# Patient Record
Sex: Female | Born: 1945 | Race: White | Hispanic: No | Marital: Married | Smoking: Never smoker
Health system: Southern US, Community
[De-identification: ages and names within clinical notes are randomized; demographics above are authoritative.]

## PROBLEM LIST (undated history)

## (undated) DIAGNOSIS — J45909 Unspecified asthma, uncomplicated: Secondary | ICD-10-CM

## (undated) DIAGNOSIS — I639 Cerebral infarction, unspecified: Secondary | ICD-10-CM

## (undated) DIAGNOSIS — I10 Essential (primary) hypertension: Secondary | ICD-10-CM

## (undated) DIAGNOSIS — E785 Hyperlipidemia, unspecified: Secondary | ICD-10-CM

## (undated) HISTORY — DX: Essential (primary) hypertension: I10

## (undated) HISTORY — DX: Cerebral infarction, unspecified: I63.9

## (undated) HISTORY — DX: Unspecified asthma, uncomplicated: J45.909

## (undated) HISTORY — PX: APPENDECTOMY: SHX54

## (undated) HISTORY — PX: TONSILLECTOMY: SHX5217

## (undated) HISTORY — DX: Hyperlipidemia, unspecified: E78.5

---

## 2007-12-21 DIAGNOSIS — I10 Essential (primary) hypertension: Secondary | ICD-10-CM | POA: Insufficient documentation

## 2007-12-22 DIAGNOSIS — Z803 Family history of malignant neoplasm of breast: Secondary | ICD-10-CM | POA: Insufficient documentation

## 2009-06-15 DIAGNOSIS — B029 Zoster without complications: Secondary | ICD-10-CM | POA: Insufficient documentation

## 2010-02-18 ENCOUNTER — Ambulatory Visit: Payer: Self-pay | Admitting: Family Medicine

## 2010-02-18 DIAGNOSIS — R7309 Other abnormal glucose: Secondary | ICD-10-CM | POA: Insufficient documentation

## 2011-12-05 DIAGNOSIS — Z23 Encounter for immunization: Secondary | ICD-10-CM | POA: Diagnosis not present

## 2011-12-05 DIAGNOSIS — I10 Essential (primary) hypertension: Secondary | ICD-10-CM | POA: Diagnosis not present

## 2011-12-05 DIAGNOSIS — E7800 Pure hypercholesterolemia, unspecified: Secondary | ICD-10-CM | POA: Diagnosis not present

## 2011-12-05 DIAGNOSIS — R7309 Other abnormal glucose: Secondary | ICD-10-CM | POA: Diagnosis not present

## 2011-12-05 DIAGNOSIS — J45901 Unspecified asthma with (acute) exacerbation: Secondary | ICD-10-CM | POA: Diagnosis not present

## 2012-01-18 ENCOUNTER — Ambulatory Visit: Payer: Self-pay | Admitting: Family Medicine

## 2012-01-18 DIAGNOSIS — R7309 Other abnormal glucose: Secondary | ICD-10-CM | POA: Diagnosis not present

## 2012-01-18 DIAGNOSIS — I10 Essential (primary) hypertension: Secondary | ICD-10-CM | POA: Diagnosis not present

## 2012-01-18 DIAGNOSIS — Z23 Encounter for immunization: Secondary | ICD-10-CM | POA: Diagnosis not present

## 2012-01-18 DIAGNOSIS — R079 Chest pain, unspecified: Secondary | ICD-10-CM | POA: Diagnosis not present

## 2012-02-01 ENCOUNTER — Ambulatory Visit: Payer: Self-pay | Admitting: Family Medicine

## 2012-02-01 DIAGNOSIS — R091 Pleurisy: Secondary | ICD-10-CM | POA: Diagnosis not present

## 2012-02-01 DIAGNOSIS — R059 Cough, unspecified: Secondary | ICD-10-CM | POA: Diagnosis not present

## 2012-02-01 DIAGNOSIS — I498 Other specified cardiac arrhythmias: Secondary | ICD-10-CM | POA: Diagnosis not present

## 2012-02-01 DIAGNOSIS — I10 Essential (primary) hypertension: Secondary | ICD-10-CM | POA: Diagnosis not present

## 2012-02-01 DIAGNOSIS — R918 Other nonspecific abnormal finding of lung field: Secondary | ICD-10-CM | POA: Diagnosis not present

## 2012-02-01 DIAGNOSIS — J189 Pneumonia, unspecified organism: Secondary | ICD-10-CM | POA: Diagnosis not present

## 2012-02-01 DIAGNOSIS — R079 Chest pain, unspecified: Secondary | ICD-10-CM | POA: Diagnosis not present

## 2012-03-08 DIAGNOSIS — D72829 Elevated white blood cell count, unspecified: Secondary | ICD-10-CM | POA: Diagnosis not present

## 2012-06-05 DIAGNOSIS — R7309 Other abnormal glucose: Secondary | ICD-10-CM | POA: Diagnosis not present

## 2012-06-05 DIAGNOSIS — I10 Essential (primary) hypertension: Secondary | ICD-10-CM | POA: Diagnosis not present

## 2012-06-05 DIAGNOSIS — E7800 Pure hypercholesterolemia, unspecified: Secondary | ICD-10-CM | POA: Diagnosis not present

## 2012-06-05 DIAGNOSIS — J45901 Unspecified asthma with (acute) exacerbation: Secondary | ICD-10-CM | POA: Diagnosis not present

## 2012-12-14 DIAGNOSIS — E7800 Pure hypercholesterolemia, unspecified: Secondary | ICD-10-CM | POA: Diagnosis not present

## 2012-12-14 DIAGNOSIS — I498 Other specified cardiac arrhythmias: Secondary | ICD-10-CM | POA: Diagnosis not present

## 2012-12-14 DIAGNOSIS — I10 Essential (primary) hypertension: Secondary | ICD-10-CM | POA: Diagnosis not present

## 2012-12-14 DIAGNOSIS — J45901 Unspecified asthma with (acute) exacerbation: Secondary | ICD-10-CM | POA: Diagnosis not present

## 2013-07-15 DIAGNOSIS — R7309 Other abnormal glucose: Secondary | ICD-10-CM | POA: Diagnosis not present

## 2013-07-15 DIAGNOSIS — I498 Other specified cardiac arrhythmias: Secondary | ICD-10-CM | POA: Diagnosis not present

## 2013-07-15 DIAGNOSIS — E7800 Pure hypercholesterolemia, unspecified: Secondary | ICD-10-CM | POA: Diagnosis not present

## 2013-07-15 DIAGNOSIS — I10 Essential (primary) hypertension: Secondary | ICD-10-CM | POA: Diagnosis not present

## 2013-08-06 DIAGNOSIS — Z23 Encounter for immunization: Secondary | ICD-10-CM | POA: Diagnosis not present

## 2013-10-17 DIAGNOSIS — E785 Hyperlipidemia, unspecified: Secondary | ICD-10-CM | POA: Diagnosis not present

## 2013-10-17 DIAGNOSIS — J45909 Unspecified asthma, uncomplicated: Secondary | ICD-10-CM | POA: Diagnosis not present

## 2013-10-17 DIAGNOSIS — I10 Essential (primary) hypertension: Secondary | ICD-10-CM | POA: Diagnosis not present

## 2014-01-13 DIAGNOSIS — E039 Hypothyroidism, unspecified: Secondary | ICD-10-CM | POA: Diagnosis not present

## 2014-01-13 DIAGNOSIS — E038 Other specified hypothyroidism: Secondary | ICD-10-CM | POA: Diagnosis not present

## 2014-01-13 DIAGNOSIS — R7309 Other abnormal glucose: Secondary | ICD-10-CM | POA: Diagnosis not present

## 2014-01-13 DIAGNOSIS — M79609 Pain in unspecified limb: Secondary | ICD-10-CM | POA: Diagnosis not present

## 2014-01-13 DIAGNOSIS — I498 Other specified cardiac arrhythmias: Secondary | ICD-10-CM | POA: Diagnosis not present

## 2014-07-09 DIAGNOSIS — E038 Other specified hypothyroidism: Secondary | ICD-10-CM | POA: Diagnosis not present

## 2014-07-09 DIAGNOSIS — I10 Essential (primary) hypertension: Secondary | ICD-10-CM | POA: Diagnosis not present

## 2014-07-09 DIAGNOSIS — R7309 Other abnormal glucose: Secondary | ICD-10-CM | POA: Diagnosis not present

## 2014-07-09 DIAGNOSIS — E7800 Pure hypercholesterolemia, unspecified: Secondary | ICD-10-CM | POA: Diagnosis not present

## 2014-07-09 DIAGNOSIS — I498 Other specified cardiac arrhythmias: Secondary | ICD-10-CM | POA: Diagnosis not present

## 2014-07-09 DIAGNOSIS — J45909 Unspecified asthma, uncomplicated: Secondary | ICD-10-CM | POA: Diagnosis not present

## 2014-07-09 LAB — CBC AND DIFFERENTIAL
HEMATOCRIT: 46 % (ref 36–46)
HEMOGLOBIN: 15.2 g/dL (ref 12.0–16.0)
Platelets: 255 10*3/uL (ref 150–399)
WBC: 5.7 10^3/mL

## 2014-07-09 LAB — LIPID PANEL
Cholesterol: 157 mg/dL (ref 0–200)
HDL: 53 mg/dL (ref 35–70)
LDL Cholesterol: 82 mg/dL
Triglycerides: 112 mg/dL (ref 40–160)

## 2014-07-09 LAB — TSH: TSH: 3.18 u[IU]/mL (ref 0.41–5.90)

## 2015-02-04 DIAGNOSIS — J45909 Unspecified asthma, uncomplicated: Secondary | ICD-10-CM | POA: Diagnosis not present

## 2015-02-04 DIAGNOSIS — R7309 Other abnormal glucose: Secondary | ICD-10-CM | POA: Diagnosis not present

## 2015-02-04 LAB — HEPATIC FUNCTION PANEL
ALT: 16 U/L (ref 7–35)
AST: 24 U/L (ref 13–35)

## 2015-02-04 LAB — BASIC METABOLIC PANEL
BUN: 16 mg/dL (ref 4–21)
CREATININE: 0.9 mg/dL (ref 0.5–1.1)
Glucose: 107 mg/dL
POTASSIUM: 4.4 mmol/L (ref 3.4–5.3)
Sodium: 142 mmol/L (ref 137–147)

## 2015-02-04 LAB — HEMOGLOBIN A1C: HEMOGLOBIN A1C: 5.8 % (ref 4.0–6.0)

## 2015-06-02 ENCOUNTER — Telehealth: Payer: Self-pay | Admitting: Family Medicine

## 2015-06-02 NOTE — Telephone Encounter (Signed)
I spoke with Marisue Humble, I advised her that we did not change her Losartan.  I verified that her bottle says Losartan .  I advised her that the pharmacy may have changed manufacturing companies, she is going to call to make sure.   Thanks,   -Vernona Rieger

## 2015-06-02 NOTE — Telephone Encounter (Signed)
Pt stated that she picked up her Losartan from the pharmacy and it looks different than what she usually gets. (Pt said the pharmacy changed her medication without her knowledge) Pt would like to speak with a nurse to make sure it is on to take. Thanks TNP

## 2015-06-02 NOTE — Telephone Encounter (Signed)
Ok. Thanks!

## 2015-07-17 ENCOUNTER — Other Ambulatory Visit: Payer: Self-pay | Admitting: Family Medicine

## 2015-07-17 DIAGNOSIS — E038 Other specified hypothyroidism: Secondary | ICD-10-CM | POA: Insufficient documentation

## 2015-07-17 DIAGNOSIS — J309 Allergic rhinitis, unspecified: Secondary | ICD-10-CM | POA: Insufficient documentation

## 2015-07-17 DIAGNOSIS — J45909 Unspecified asthma, uncomplicated: Secondary | ICD-10-CM | POA: Insufficient documentation

## 2015-07-17 DIAGNOSIS — Z719 Counseling, unspecified: Secondary | ICD-10-CM | POA: Insufficient documentation

## 2015-07-17 DIAGNOSIS — M79603 Pain in arm, unspecified: Secondary | ICD-10-CM | POA: Insufficient documentation

## 2015-07-17 DIAGNOSIS — E039 Hypothyroidism, unspecified: Secondary | ICD-10-CM | POA: Insufficient documentation

## 2015-07-17 DIAGNOSIS — R142 Eructation: Secondary | ICD-10-CM | POA: Insufficient documentation

## 2015-07-17 DIAGNOSIS — Z23 Encounter for immunization: Secondary | ICD-10-CM | POA: Insufficient documentation

## 2015-07-17 DIAGNOSIS — I10 Essential (primary) hypertension: Secondary | ICD-10-CM

## 2015-07-17 NOTE — Telephone Encounter (Signed)
Next ov appointment is on 08/07/2015.  Thanks,

## 2015-08-07 ENCOUNTER — Encounter: Payer: Self-pay | Admitting: Family Medicine

## 2015-08-19 ENCOUNTER — Encounter: Payer: Self-pay | Admitting: Family Medicine

## 2015-08-19 ENCOUNTER — Ambulatory Visit (INDEPENDENT_AMBULATORY_CARE_PROVIDER_SITE_OTHER): Payer: Medicare Other | Admitting: Family Medicine

## 2015-08-19 VITALS — BP 104/68 | HR 64 | Temp 97.7°F | Resp 16 | Ht 64.0 in | Wt 170.0 lb

## 2015-08-19 DIAGNOSIS — E7800 Pure hypercholesterolemia, unspecified: Secondary | ICD-10-CM | POA: Diagnosis not present

## 2015-08-19 DIAGNOSIS — I10 Essential (primary) hypertension: Secondary | ICD-10-CM | POA: Diagnosis not present

## 2015-08-19 DIAGNOSIS — E785 Hyperlipidemia, unspecified: Secondary | ICD-10-CM | POA: Diagnosis not present

## 2015-08-19 DIAGNOSIS — R7309 Other abnormal glucose: Secondary | ICD-10-CM | POA: Diagnosis not present

## 2015-08-19 LAB — POCT GLYCOSYLATED HEMOGLOBIN (HGB A1C)
Est. average glucose Bld gHb Est-mCnc: 117
Hemoglobin A1C: 5.7

## 2015-08-19 MED ORDER — SIMVASTATIN 20 MG PO TABS: 20.0000 mg | ORAL_TABLET | Freq: Every day | ORAL | Status: DC

## 2015-08-19 MED ORDER — AMLODIPINE BESYLATE 5 MG PO TABS: 5.0000 mg | ORAL_TABLET | Freq: Every day | ORAL | Status: DC

## 2015-08-19 MED ORDER — AMLODIPINE BESYLATE 2.5 MG PO TABS: 2.5000 mg | ORAL_TABLET | Freq: Every day | ORAL | Status: DC

## 2015-08-19 NOTE — Progress Notes (Signed)
Subjective:    Patient ID: Michele Lawrence, female    DOB: 1945-12-19, 69 y.o.   MRN: [id number]  HPI Comments: Pt needs refills on Amlodipine and Simvastatin sent to [facility] [place] [place].  Hyperglycemia This is a chronic (Last A1C 02/04/2015. 5.4%.) problem. The current episode started today. The problem has been unchanged. Associated symptoms include a fever (2 weeks ago. Resolved on it's own.  Was 101.  ). Pertinent negatives include no abdominal pain, anorexia, arthralgias, change in bowel habit, chest pain, chills, congestion, coughing, diaphoresis, fatigue, headaches, joint swelling, nausea, neck pain, numbness, rash, sore throat, swollen glands, urinary symptoms, vertigo, visual change, vomiting or weakness. Myalgias: rib pain/ "infection".  Strained it.  Got better.    Hypertension This is a chronic problem. Pertinent negatives include no anxiety, blurred vision, chest pain, headaches, malaise/fatigue, neck pain, orthopnea, palpitations, peripheral edema, shortness of breath or sweats. Treatments tried: Amlodipine 5 mg, Losartan 10 mg. The current treatment provides moderate improvement. There are no compliance problems.   Hyperlipidemia This is a chronic problem. Recent lipid tests were reviewed and are normal (07/09/2014- Total- 157, Trig- 112, LDL- 82, HDL- 53). Pertinent negatives include no chest pain or shortness of breath. Myalgias: rib pain/ "infection".  Strained it.  Got better.   Current antihyperlipidemic treatment includes statins (Simvastatin 20 mg). There are no compliance problems.  Risk factors for coronary artery disease include dyslipidemia and post-menopausal.   Has been very active. Helping her [place] who [place] moved to [place].     Review of Systems  Constitutional: Positive for fever (2 weeks ago. Resolved on it's own.  Was 101.  ). Negative for chills, malaise/fatigue, diaphoresis and fatigue.  HENT: Negative for congestion and sore throat.   Eyes:  Negative for blurred vision.  Respiratory: Negative for cough and shortness of breath.   Cardiovascular: Negative for chest pain, palpitations and orthopnea.  Gastrointestinal: Negative for nausea, vomiting, abdominal pain, anorexia and change in bowel habit.  Musculoskeletal: Negative for joint swelling, arthralgias and neck pain. Myalgias: rib pain/ "infection".  Strained it.  Got better.    Skin: Negative for rash.  Neurological: Negative for vertigo, weakness, numbness and headaches.   BP 104/68 mmHg  Pulse 64  Temp(Src) 97.7 F (36.5 C) (Oral)  Resp 16  Ht 5\' 4"  (1.626 m)  Wt 170 lb (77.111 kg)  BMI 29.17 kg/m2   Patient Active Problem List   Diagnosis Date Noted  . Need for vaccination 07/17/2015  . Subclinical hypothyroidism 07/17/2015  . Calcium blood increased 07/17/2015  . Encounter for counseling 07/17/2015  . Belching 07/17/2015  . Airway hyperreactivity 07/17/2015  . Arm pain 07/17/2015  . Allergic rhinitis 07/17/2015  . Encounter for general adult medical examination without abnormal findings 02/18/2010  . Abnormal blood sugar 02/18/2010  . Herpes zona 06/15/2009  . Family history of breast cancer 12/22/2007  . Benign hypertension 12/21/2007  . Acute asthma exacerbation 05/17/2007  . Hypercholesteremia 04/17/2007   No past medical history on file. Current Outpatient Prescriptions on File Prior to Visit  Medication Sig  . albuterol (VENTOLIN HFA) 108 (90 BASE) MCG/ACT inhaler Inhale 2 sprays into the lungs. Every 6 hours PRN  . amLODipine (NORVASC) 5 MG tablet Take 1 tablet by mouth daily.  Marland Kitchen. aspirin 81 MG tablet   . calcium carbonate (TUMS) 500 MG chewable tablet Chew 1 tablet by mouth as needed.  Marland Kitchen. ibuprofen (ADVIL) 200 MG tablet Take by mouth. 1 tablet q 4-6 hours PRN  .  loratadine (CLARITIN) 10 MG tablet Take 1 tablet by mouth daily.  Marland Kitchen losartan (COZAAR) 50 MG tablet TAKE ONE TABLET BY MOUTH ONCE DAILY  . MULTIPLE VITAMINS-MINERALS PO Take 1 tablet by  mouth daily.  . simvastatin (ZOCOR) 20 MG tablet Take 1 tablet by mouth daily.  Marland Kitchen acetaminophen (TYLENOL) 500 MG tablet Take by mouth.   No current facility-administered medications on file prior to visit.   Allergies  Allergen Reactions  . Penicillins    No past surgical history on file. Social History   Social History  . Marital Status: Married    Spouse Name: N/A  . Number of Children: N/A  . Years of Education: N/A   Occupational History  . Not on file.   Social History Main Topics  . Smoking status: Never Smoker   . Smokeless tobacco: Never Used  . Alcohol Use: Yes     Comment: 1/2 glass of wine qhs  . Drug Use: No  . Sexual Activity: Not on file   Other Topics Concern  . Not on file   Social History Narrative   Family History  Problem Relation Age of Onset  . Cancer Mother     breast  . Diabetes Mother   . Stroke Mother   . CVA Mother   . Cataracts Mother   . Psoriasis Mother   . Alcohol abuse Father   . Heart disease Sister        Objective:   Physical Exam  Constitutional: She is oriented to person, place, and time. She appears well-developed and well-nourished.  Cardiovascular: Normal rate and regular rhythm.   Pulmonary/Chest: Effort normal and breath sounds normal.  Musculoskeletal: She exhibits no edema.  Neurological: She is alert and oriented to person, place, and time.  Skin: Skin is warm and dry.  Psychiatric: She has a normal mood and affect. Her behavior is normal. Judgment and thought content normal.   BP 104/68 mmHg  Pulse 64  Temp(Src) 97.7 F (36.5 C) (Oral)  Resp 16  Ht  (1.626 m)  Wt 170 lb (77.111 kg)  BMI 29.17 kg/m2      Assessment & Plan:  1. Abnormal blood sugar Stable. Continue current lifestyle  and recheck in 6 months.  Stay active and continue to eat healthy. - POCT glycosylated hemoglobin (Hb A1C) Results for orders placed or performed in visit on 08/19/15  POCT glycosylated hemoglobin (Hb A1C)  Result  Value Ref Range   Hemoglobin A1C 5.7    Est. average glucose Bld gHb Est-mCnc 117     2. Hyperlipidemia Stable. Continue current medication. Time to recheck labs. No current side effects.   - Lipid panel - Comprehensive metabolic panel - CBC with Differential/Platelet - simvastatin (ZOCOR) 20 MG tablet; Take 1 tablet (20 mg total) by mouth daily.  Dispense: 30 tablet; Refill: 6  3. Benign hypertension Stable. Will decrease amlodipine to 2.5 mg  and continue to monitor.  Call if not well controlled and will increase back up.  - Comprehensive metabolic panel - CBC with Differential/Platelet  Lorie Phenix, MD

## 2015-08-20 ENCOUNTER — Telehealth: Payer: Self-pay

## 2015-08-20 LAB — CBC WITH DIFFERENTIAL/PLATELET
BASOS ABS: 0 10*3/uL (ref 0.0–0.2)
BASOS: 1 %
EOS (ABSOLUTE): 0.1 10*3/uL (ref 0.0–0.4)
Eos: 3 %
Hematocrit: 42.9 % (ref 34.0–46.6)
Hemoglobin: 14 g/dL (ref 11.1–15.9)
IMMATURE GRANS (ABS): 0 10*3/uL (ref 0.0–0.1)
IMMATURE GRANULOCYTES: 1 %
LYMPHS: 23 %
Lymphocytes Absolute: 1.2 10*3/uL (ref 0.7–3.1)
MCH: 28.7 pg (ref 26.6–33.0)
MCHC: 32.6 g/dL (ref 31.5–35.7)
MCV: 88 fL (ref 79–97)
Monocytes Absolute: 0.5 10*3/uL (ref 0.1–0.9)
Monocytes: 10 %
NEUTROS PCT: 62 %
Neutrophils Absolute: 3.3 10*3/uL (ref 1.4–7.0)
PLATELETS: 289 10*3/uL (ref 150–379)
RBC: 4.88 x10E6/uL (ref 3.77–5.28)
RDW: 13.8 % (ref 12.3–15.4)
WBC: 5.2 10*3/uL (ref 3.4–10.8)

## 2015-08-20 LAB — COMPREHENSIVE METABOLIC PANEL
A/G RATIO: 2.3 (ref 1.1–2.5)
ALBUMIN: 4.5 g/dL (ref 3.5–4.8)
ALT: 16 IU/L (ref 0–32)
AST: 25 IU/L (ref 0–40)
Alkaline Phosphatase: 82 IU/L (ref 39–117)
BILIRUBIN TOTAL: 0.5 mg/dL (ref 0.0–1.2)
BUN/Creatinine Ratio: 21 (ref 11–26)
BUN: 18 mg/dL (ref 8–27)
CALCIUM: 9.8 mg/dL (ref 8.7–10.3)
CHLORIDE: 103 mmol/L (ref 97–108)
CO2: 24 mmol/L (ref 18–29)
Creatinine, Ser: 0.84 mg/dL (ref 0.57–1.00)
GFR calc Af Amer: 81 mL/min/{1.73_m2} (ref >59)
GFR, EST NON AFRICAN AMERICAN: 71 mL/min/{1.73_m2} (ref >59)
Globulin, Total: 2 g/dL (ref 1.5–4.5)
Glucose: 108 mg/dL — ABNORMAL HIGH (ref 65–99)
POTASSIUM: 4.6 mmol/L (ref 3.5–5.2)
Sodium: 144 mmol/L (ref 134–144)
TOTAL PROTEIN: 6.5 g/dL (ref 6.0–8.5)

## 2015-08-20 LAB — LIPID PANEL
CHOL/HDL RATIO: 3.1 ratio (ref 0.0–4.4)
CHOLESTEROL TOTAL: 168 mg/dL (ref 100–199)
HDL: 54 mg/dL (ref >39)
LDL Calculated: 94 mg/dL (ref 0–99)
TRIGLYCERIDES: 102 mg/dL (ref 0–149)
VLDL Cholesterol Cal: 20 mg/dL (ref 5–40)

## 2015-08-20 NOTE — Telephone Encounter (Signed)
Pt advised.   Thanks,   -Audrey Thull  

## 2015-08-20 NOTE — Telephone Encounter (Signed)
LMTCB 08/20/2015  Thanks,  -Takeira Yanes  

## 2015-08-20 NOTE — Telephone Encounter (Signed)
-----   Message from Lorie PhenixNancy Maloney, MD sent at 08/20/2015  7:54 AM EDT ----- Stable. Please notify patient. Thanks.

## 2015-09-25 ENCOUNTER — Other Ambulatory Visit: Payer: Self-pay | Admitting: Family Medicine

## 2015-09-25 DIAGNOSIS — J45909 Unspecified asthma, uncomplicated: Secondary | ICD-10-CM

## 2015-12-13 DIAGNOSIS — R9089 Other abnormal findings on diagnostic imaging of central nervous system: Secondary | ICD-10-CM | POA: Diagnosis not present

## 2015-12-13 DIAGNOSIS — E785 Hyperlipidemia, unspecified: Secondary | ICD-10-CM | POA: Diagnosis not present

## 2015-12-13 DIAGNOSIS — I63511 Cerebral infarction due to unspecified occlusion or stenosis of right middle cerebral artery: Secondary | ICD-10-CM | POA: Diagnosis not present

## 2015-12-13 DIAGNOSIS — R0689 Other abnormalities of breathing: Secondary | ICD-10-CM | POA: Diagnosis not present

## 2015-12-13 DIAGNOSIS — I471 Supraventricular tachycardia: Secondary | ICD-10-CM | POA: Diagnosis not present

## 2015-12-13 DIAGNOSIS — E876 Hypokalemia: Secondary | ICD-10-CM | POA: Diagnosis not present

## 2015-12-13 DIAGNOSIS — R739 Hyperglycemia, unspecified: Secondary | ICD-10-CM | POA: Diagnosis not present

## 2015-12-13 DIAGNOSIS — R0789 Other chest pain: Secondary | ICD-10-CM | POA: Diagnosis not present

## 2015-12-13 DIAGNOSIS — R930 Abnormal findings on diagnostic imaging of skull and head, not elsewhere classified: Secondary | ICD-10-CM | POA: Diagnosis not present

## 2015-12-13 DIAGNOSIS — G8194 Hemiplegia, unspecified affecting left nondominant side: Secondary | ICD-10-CM | POA: Diagnosis not present

## 2015-12-13 DIAGNOSIS — Z880 Allergy status to penicillin: Secondary | ICD-10-CM | POA: Diagnosis not present

## 2015-12-13 DIAGNOSIS — I63311 Cerebral infarction due to thrombosis of right middle cerebral artery: Secondary | ICD-10-CM | POA: Diagnosis not present

## 2015-12-13 DIAGNOSIS — I10 Essential (primary) hypertension: Secondary | ICD-10-CM | POA: Diagnosis not present

## 2015-12-13 DIAGNOSIS — R29704 NIHSS score 4: Secondary | ICD-10-CM | POA: Diagnosis present

## 2015-12-13 DIAGNOSIS — J45909 Unspecified asthma, uncomplicated: Secondary | ICD-10-CM | POA: Diagnosis present

## 2015-12-13 DIAGNOSIS — I639 Cerebral infarction, unspecified: Secondary | ICD-10-CM | POA: Diagnosis not present

## 2015-12-13 DIAGNOSIS — R079 Chest pain, unspecified: Secondary | ICD-10-CM | POA: Diagnosis not present

## 2015-12-13 DIAGNOSIS — Z8249 Family history of ischemic heart disease and other diseases of the circulatory system: Secondary | ICD-10-CM | POA: Diagnosis not present

## 2015-12-13 DIAGNOSIS — R0602 Shortness of breath: Secondary | ICD-10-CM | POA: Diagnosis not present

## 2015-12-13 DIAGNOSIS — I6521 Occlusion and stenosis of right carotid artery: Secondary | ICD-10-CM | POA: Diagnosis present

## 2015-12-14 DIAGNOSIS — I639 Cerebral infarction, unspecified: Secondary | ICD-10-CM | POA: Insufficient documentation

## 2015-12-14 DIAGNOSIS — Z8673 Personal history of transient ischemic attack (TIA), and cerebral infarction without residual deficits: Secondary | ICD-10-CM | POA: Insufficient documentation

## 2015-12-18 DIAGNOSIS — I69319 Unspecified symptoms and signs involving cognitive functions following cerebral infarction: Secondary | ICD-10-CM | POA: Diagnosis not present

## 2015-12-18 DIAGNOSIS — I10 Essential (primary) hypertension: Secondary | ICD-10-CM | POA: Diagnosis not present

## 2015-12-18 DIAGNOSIS — Z9181 History of falling: Secondary | ICD-10-CM | POA: Diagnosis not present

## 2015-12-18 DIAGNOSIS — J45909 Unspecified asthma, uncomplicated: Secondary | ICD-10-CM | POA: Diagnosis not present

## 2015-12-18 DIAGNOSIS — I69354 Hemiplegia and hemiparesis following cerebral infarction affecting left non-dominant side: Secondary | ICD-10-CM | POA: Diagnosis not present

## 2015-12-18 DIAGNOSIS — E785 Hyperlipidemia, unspecified: Secondary | ICD-10-CM | POA: Diagnosis not present

## 2015-12-19 DIAGNOSIS — Z9181 History of falling: Secondary | ICD-10-CM | POA: Diagnosis not present

## 2015-12-19 DIAGNOSIS — I69319 Unspecified symptoms and signs involving cognitive functions following cerebral infarction: Secondary | ICD-10-CM | POA: Diagnosis not present

## 2015-12-19 DIAGNOSIS — J45909 Unspecified asthma, uncomplicated: Secondary | ICD-10-CM | POA: Diagnosis not present

## 2015-12-19 DIAGNOSIS — E785 Hyperlipidemia, unspecified: Secondary | ICD-10-CM | POA: Diagnosis not present

## 2015-12-19 DIAGNOSIS — I69354 Hemiplegia and hemiparesis following cerebral infarction affecting left non-dominant side: Secondary | ICD-10-CM | POA: Diagnosis not present

## 2015-12-19 DIAGNOSIS — I10 Essential (primary) hypertension: Secondary | ICD-10-CM | POA: Diagnosis not present

## 2015-12-21 ENCOUNTER — Other Ambulatory Visit: Payer: Self-pay

## 2015-12-22 ENCOUNTER — Ambulatory Visit (INDEPENDENT_AMBULATORY_CARE_PROVIDER_SITE_OTHER): Payer: Medicare Other | Admitting: Family Medicine

## 2015-12-22 ENCOUNTER — Encounter: Payer: Self-pay | Admitting: Family Medicine

## 2015-12-22 VITALS — BP 118/68 | HR 64 | Temp 97.6°F | Resp 16 | Wt 171.0 lb

## 2015-12-22 DIAGNOSIS — I639 Cerebral infarction, unspecified: Secondary | ICD-10-CM | POA: Diagnosis not present

## 2015-12-22 DIAGNOSIS — E785 Hyperlipidemia, unspecified: Secondary | ICD-10-CM | POA: Diagnosis not present

## 2015-12-22 MED ORDER — ATORVASTATIN CALCIUM 80 MG PO TABS: 80.0000 mg | ORAL_TABLET | Freq: Every day | ORAL | Status: DC

## 2015-12-22 NOTE — Progress Notes (Signed)
Subjective:     Patient ID: Michele Lawrence, female   DOB: 15-Nov-1945, 70 y.o.   MRN: [id number]  Chief Complaint  Patient presents with  . Hospitalization Follow-up    HPI  Here for follow-up after hospitalization at [facility] 2/5-12/15/15 after stroke. Event started with "asthma attack", she used Ventolin at that point. Then she had sudden onset of weakness all over "like jello, numbness and unable to move left arm and leg. No slurring but quiet speech. No loss of consciousness or bowel or bladder function. Family gave her ASA, and drove her to [facility]. She was given tPA and  admitted to ICU. She started to regain movement and sensation of her left side later that night, approximately 8 hrs after tPA. She was discharged on 81 ASA, statin changed to atorvastatin 80 mg.   Pt currently report she still has  quiet voice. 90% strength on left hand, full strength left leg. She feels tired but not weak. She has a walker which she is not using.  She has PT at home 3 times a week.  Imaging in the hospital was negative for ischemia.  She is continuing to improve.   She is here with husband today. She does seem slightly more confused and unsure of herself than usual today.    Patient Active Problem List   Diagnosis Date Noted  . Ischemic stroke (HCC) 12/14/2015  . Need for vaccination 07/17/2015  . Subclinical hypothyroidism 07/17/2015  . Calcium blood increased 07/17/2015  . Encounter for counseling 07/17/2015  . Belching 07/17/2015  . Airway hyperreactivity 07/17/2015  . Arm pain 07/17/2015  . Allergic rhinitis 07/17/2015  . Encounter for general adult medical examination without abnormal findings 02/18/2010  . Abnormal blood sugar 02/18/2010  . Herpes zona 06/15/2009  . Family history of breast cancer 12/22/2007  . Benign hypertension 12/21/2007  . Acute asthma exacerbation 05/17/2007  . HLD (hyperlipidemia) 04/17/2007   Previous Medications   ACETAMINOPHEN (TYLENOL) 500 MG TABLET     Take by mouth.   AMLODIPINE (NORVASC) 2.5 MG TABLET    Take 1 tablet (2.5 mg total) by mouth daily. Dose change. Please notify patient. Thanks.   ASPIRIN 81 MG TABLET       ATORVASTATIN (LIPITOR) 80 MG TABLET    Take 80 mg by mouth.   IBUPROFEN (ADVIL) 200 MG TABLET    Take by mouth. 1 tablet q 4-6 hours PRN   LORATADINE (CLARITIN) 10 MG TABLET    Take 1 tablet by mouth daily.   LOSARTAN (COZAAR) 50 MG TABLET    TAKE ONE TABLET BY MOUTH ONCE DAILY   MULTIPLE VITAMINS-MINERALS PO    Take 1 tablet by mouth daily.   VENTOLIN HFA 108 (90 BASE) MCG/ACT INHALER    INHALE 2 PUFFS EVERY 4-6 HOURS AS NEEDED   Allergies  Allergen Reactions  . Penicillins    No past surgical history on file. Family History  Problem Relation Age of Onset  . Cancer Mother     breast  . Diabetes Mother   . Stroke Mother   . CVA Mother   . Cataracts Mother   . Psoriasis Mother   . Alcohol abuse Father   . Heart disease Sister    Social History   Social History  . Marital Status: Married    Spouse Name: N/A  . Number of Children: N/A  . Years of Education: N/A   Occupational History  . Not on file.   Social History  Main Topics  . Smoking status: Never Smoker   . Smokeless tobacco: Never Used  . Alcohol Use: Yes     Comment: 1/2 glass of wine qhs  . Drug Use: No  . Sexual Activity: Not on file   Other Topics Concern  . Not on file   Social History Narrative    Review of Systems  Constitutional:       More tired  HENT: Negative.   Respiratory: Negative.   Cardiovascular: Negative.   Gastrointestinal: Negative.   Genitourinary: Negative.   Musculoskeletal: Positive for gait problem (is slightly unsteady. Using her husband for support. ).  Neurological: Positive for weakness (slight weakness left hand). Negative for facial asymmetry and numbness.  Hematological: Negative.   Psychiatric/Behavioral: Negative.        Objective:   Physical Exam  Constitutional: She appears well-developed  and well-nourished. No distress.  HENT:  Head: Normocephalic and atraumatic.  Eyes: EOM are normal. Pupils are equal, round, and reactive to light.  Cardiovascular: Normal rate.   Irregular at times.    Pulmonary/Chest: Effort normal and breath sounds normal.  Musculoskeletal: Normal range of motion. She exhibits no edema.  Neurological: She is alert. No cranial nerve deficit.  Slight decrease in grip strength in left hand.     Skin: Skin is warm and dry. She is not diaphoretic.  Psychiatric: She has a normal mood and affect.    BP 118/68 mmHg  Pulse 64  Temp(Src) 97.6 F (36.4 C) (Oral)  Resp 16  Wt 171 lb (77.565 kg)     Assessment:     CVA    Plan:     1. Ischemic stroke (HCC) New problem.  Need to complete work up for etiology.  Continue current medication changes. Will refer to cardiology, concerning for a fib and also schedule dopplers. Further plan pending these results.  - Ambulatory referral to Cardiology - US Carotid Duplex Bilateral; Future  2. HLD (hyperlipidemia) Continue new medication. Recheck labs in one to two weeks.   - Lipid panel - Comprehensive Metabolic Panel (CMET) - atorvastatin (LIPITOR) 80 MG tablet; Take 1 tablet (80 mg total) by mouth daily at 6 PM.  Dispense: 90 tablet; Refill: 3     Lorie Phenix, MD

## 2015-12-24 DIAGNOSIS — E785 Hyperlipidemia, unspecified: Secondary | ICD-10-CM | POA: Diagnosis not present

## 2015-12-25 ENCOUNTER — Telehealth: Payer: Self-pay

## 2015-12-25 LAB — LIPID PANEL
CHOL/HDL RATIO: 3 ratio (ref 0.0–4.4)
Cholesterol, Total: 137 mg/dL (ref 100–199)
HDL: 46 mg/dL (ref >39)
LDL Calculated: 76 mg/dL (ref 0–99)
TRIGLYCERIDES: 76 mg/dL (ref 0–149)
VLDL CHOLESTEROL CAL: 15 mg/dL (ref 5–40)

## 2015-12-25 LAB — COMPREHENSIVE METABOLIC PANEL
A/G RATIO: 2.2 (ref 1.1–2.5)
ALBUMIN: 4.1 g/dL (ref 3.5–4.8)
ALT: 22 IU/L (ref 0–32)
AST: 27 IU/L (ref 0–40)
Alkaline Phosphatase: 82 IU/L (ref 39–117)
BILIRUBIN TOTAL: 0.5 mg/dL (ref 0.0–1.2)
BUN/Creatinine Ratio: 25 (ref 11–26)
BUN: 22 mg/dL (ref 8–27)
CHLORIDE: 102 mmol/L (ref 96–106)
CO2: 25 mmol/L (ref 18–29)
Calcium: 9.8 mg/dL (ref 8.7–10.3)
Creatinine, Ser: 0.89 mg/dL (ref 0.57–1.00)
GFR calc non Af Amer: 65 mL/min/{1.73_m2} (ref >59)
GFR, EST AFRICAN AMERICAN: 75 mL/min/{1.73_m2} (ref >59)
Globulin, Total: 1.9 g/dL (ref 1.5–4.5)
Glucose: 94 mg/dL (ref 65–99)
POTASSIUM: 4.5 mmol/L (ref 3.5–5.2)
Sodium: 142 mmol/L (ref 134–144)
TOTAL PROTEIN: 6 g/dL (ref 6.0–8.5)

## 2015-12-25 NOTE — Telephone Encounter (Signed)
-----   Message from Lorie Phenix, MD sent at 12/25/2015  7:08 AM EST ----- Labs stable. Please notify patient.  Thanks.

## 2015-12-25 NOTE — Telephone Encounter (Signed)
Advised pt of lab results. Pt verbally acknowledges understanding. Emily Drozdowski, CMA   

## 2015-12-28 ENCOUNTER — Ambulatory Visit
Admission: RE | Admit: 2015-12-28 | Discharge: 2015-12-28 | Disposition: A | Discharge status: Home / Self Care | Payer: Medicare Other | Source: Ambulatory Visit | Attending: Family Medicine | Admitting: Family Medicine

## 2015-12-28 DIAGNOSIS — I63231 Cerebral infarction due to unspecified occlusion or stenosis of right carotid arteries: Secondary | ICD-10-CM | POA: Diagnosis not present

## 2015-12-28 DIAGNOSIS — I6523 Occlusion and stenosis of bilateral carotid arteries: Secondary | ICD-10-CM | POA: Insufficient documentation

## 2015-12-28 DIAGNOSIS — I639 Cerebral infarction, unspecified: Secondary | ICD-10-CM | POA: Insufficient documentation

## 2015-12-29 ENCOUNTER — Telehealth: Payer: Self-pay

## 2015-12-29 NOTE — Telephone Encounter (Signed)
Advised pt as below. Ernest Orr Drozdowski, CMA  

## 2015-12-29 NOTE — Telephone Encounter (Signed)
-----   Message from Lorie Phenix, MD sent at 12/28/2015  2:54 PM EST ----- Some mild plaque build up. Continue medical treatment already in place.  Thanks.

## 2016-01-06 DIAGNOSIS — I69354 Hemiplegia and hemiparesis following cerebral infarction affecting left non-dominant side: Secondary | ICD-10-CM | POA: Diagnosis not present

## 2016-01-19 ENCOUNTER — Encounter: Payer: Self-pay | Admitting: Cardiology

## 2016-01-19 ENCOUNTER — Telehealth: Payer: Self-pay | Admitting: *Deleted

## 2016-01-19 ENCOUNTER — Ambulatory Visit (INDEPENDENT_AMBULATORY_CARE_PROVIDER_SITE_OTHER): Payer: Medicare Other | Admitting: Cardiology

## 2016-01-19 VITALS — BP 120/90 | HR 70 | Ht 65.0 in | Wt 171.1 lb

## 2016-01-19 DIAGNOSIS — R002 Palpitations: Secondary | ICD-10-CM | POA: Diagnosis not present

## 2016-01-19 DIAGNOSIS — R0602 Shortness of breath: Secondary | ICD-10-CM

## 2016-01-19 DIAGNOSIS — I639 Cerebral infarction, unspecified: Secondary | ICD-10-CM | POA: Diagnosis not present

## 2016-01-19 DIAGNOSIS — I444 Left anterior fascicular block: Secondary | ICD-10-CM

## 2016-01-19 DIAGNOSIS — I10 Essential (primary) hypertension: Secondary | ICD-10-CM

## 2016-01-19 DIAGNOSIS — E785 Hyperlipidemia, unspecified: Secondary | ICD-10-CM

## 2016-01-19 NOTE — Telephone Encounter (Signed)
Reviewed AVS instructions with patient and she wants to hold off on doing the event monitor until after their vacations that are coming up. Notified Dr. Alvino ChapelIngal and she is aware. Patient states she will call back for event monitor once she is back which will be in May timeframe.

## 2016-01-19 NOTE — Progress Notes (Signed)
Cardiology Office Note   Date:  01/19/2016   ID:  Michele Lawrence, DOB [birth year], MRN [id number]  Referring Doctor:  Lorie Phenix, MD   Cardiologist:   Almond Lint, MD   Reason for consultation:  Chief Complaint  Patient presents with  . Cerebrovascular Accident      History of Present Illness: Michele Lawrence is a 70 y.o. female who presents for Evaluation for her history of CVA, evaluation for ongoing shortness of breath.  In February 2017, patient presented with sudden onset of left-sided weakness and numbness. She went to [facility] and she was diagnosed to have a stroke. A CT head without contrast from 12/13/2015 showed subtle hyperdensity in the right MCA, may represent acute thrombus. No other evidence of acute infarction or intracranial hemorrhage. She has almost full recovery of her left-sided weakness.  She reports palpitations. This has been going on for years. She describes it as a racing heartbeat, moderate in intensity, lasting minutes at a time, randomly occurring, no known precipitating or palliating factors. Symptoms are in the chest, nonradiating.  She also describes shortness of breath. Again, has been going on for many years. Shortness of breath is episodic, which she thinks is her asthma attack. Moderate to severe intensity, lasting minutes at a time, sudden in onset. Not always exertionAL in nature. Most of the time, randomly occurring. Symptoms remain in the chest. Nonradiating.  Patient denies history of loss of consciousness, headache, cough, colds, abdominal pain, PND, orthopnea, edema.   ROS:  Please see the history of present illness. Aside from mentioned under HPI, all other systems are reviewed and negative.     Past Medical History  Diagnosis Date  . Hypertension   . Asthma   . Stroke [facility])     History reviewed. No pertinent past surgical history.   reports that she has never smoked. She has never used smokeless tobacco. She reports that  she drinks alcohol. She reports that she does not use illicit drugs.   family history includes Alcohol abuse in her father; CVA in her mother; Cancer in her mother; Cataracts in her mother; Diabetes in her mother; Heart disease in her sister; Psoriasis in her mother; Stroke in her mother.   Current Outpatient Prescriptions  Medication Sig Dispense Refill  . acetaminophen (TYLENOL) 500 MG tablet Take 500 mg by mouth every 6 (six) hours as needed for moderate pain or headache.     Marland Kitchen amLODipine (NORVASC) 2.5 MG tablet Take 1 tablet (2.5 mg total) by mouth daily. Dose change. Please notify patient. Thanks. 90 tablet 3  . aspirin 81 MG tablet Take 81 mg by mouth daily.     Marland Kitchen atorvastatin (LIPITOR) 80 MG tablet Take 1 tablet (80 mg total) by mouth daily at 6 PM. 90 tablet 3  . ibuprofen (ADVIL) 200 MG tablet Take by mouth. 1 tablet q 4-6 hours PRN    . loratadine (CLARITIN) 10 MG tablet Take 1 tablet by mouth daily.    Marland Kitchen losartan (COZAAR) 50 MG tablet TAKE ONE TABLET BY MOUTH ONCE DAILY 90 tablet 3  . MULTIPLE VITAMINS-MINERALS PO Take 1 tablet by mouth daily.    . VENTOLIN HFA 108 (90 BASE) MCG/ACT inhaler INHALE 2 PUFFS EVERY 4-6 HOURS AS NEEDED 36 g 2   No current facility-administered medications for this visit.    Allergies: Penicillins    PHYSICAL EXAM: VS:  BP 120/90 mmHg  Pulse 70  Ht  (1.651 m)  Wt 171  lb 1.9 oz (77.62 kg)  BMI 28.48 kg/m2 , Body mass index is 28.48 kg/(m^2). Wt Readings from Last 3 Encounters:  01/19/16 171 lb 1.9 oz (77.62 kg)  12/22/15 171 lb (77.565 kg)  08/19/15 170 lb (77.111 kg)    GENERAL:  well developed, well nourished,Overweight, not in acute distress HEENT: normocephalic, pink conjunctivae, anicteric sclerae, no xanthelasma, normal dentition, oropharynx clear NECK:  no neck vein engorgement, JVP normal, no hepatojugular reflux, carotid upstroke brisk and symmetric, no bruit, no thyromegaly, no lymphadenopathy LUNGS:  good respiratory effort,  clear to auscultation bilaterally CV:  PMI not displaced, no thrills, no lifts, S1 and S2 within normal limits, no palpable S3 or S4, no murmurs, no rubs, no gallops ABD:  Soft, nontender, nondistended, normoactive bowel sounds, no abdominal aortic bruit, no hepatomegaly, no splenomegaly MS: nontender back, no kyphosis, no scoliosis, no joint deformities EXT:  2+ DP/PT pulses, no edema, no varicosities, no cyanosis, no clubbing SKIN: warm, nondiaphoretic, normal turgor, no ulcers NEUROPSYCH: alert, oriented to person, place, and time, sensory/motor grossly intact, normal mood, appropriate affect  Recent Labs: 08/19/2015: Platelets 289 12/24/2015: ALT 22; BUN 22; Creatinine, Ser 0.89; Potassium 4.5; Sodium 142   Lipid Panel    Component Value Date/Time   CHOL 137 12/24/2015 0913   CHOL 157 07/09/2014   TRIG 76 12/24/2015 0913   HDL 46 12/24/2015 0913   HDL 53 07/09/2014   CHOLHDL 3.0 12/24/2015 0913   LDLCALC 76 12/24/2015 0913   LDLCALC 82 07/09/2014     Other studies Reviewed:  EKG:  EKG is ordered today. The ekg ordered today 01/19/2016 was personally reviewed by me and it reveals sinus rhythm, 70 BPM. LAFB.  Additional studies/ records that were reviewed personally reviewed by me today include:  None available  ASSESSMENT AND PLAN:  History of CVA No evidence of multiple foci, embolic in nature Agree with blood pressure control, and aggressive statin therapy for LDL goal of less than 70 Recommend neurology follow-up Recommend echocardiogram Recommend event monitor to detect any arrhythmia/atrial fibrillation  Palpitations Recommend event monitor as discussed above  Shortness of breath Recommend echocardiogram Recommend evaluation for ischemia with exercise nuclear stress testing, modified Bruce protocol  Hypertension Recommend blood pressure log. Continue medications for now.  Hyperlipidemia Due to history of CVA, LDL goal is likely fast and 70. PCP following  her for this.  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  The patient does not have concerns regarding medicines.  Labs/ tests ordered today include:  Orders Placed This Encounter  Procedures  . NM Myocar Multi W/Spect W/Wall Motion / EF  . Cardiac event monitor  . EKG 12-Lead  . ECHOCARDIOGRAM COMPLETE    I had a lengthy and detailed discussion with the patient regarding iagnoses, prognosis, diagnostic options, treatment options.  I counseled the patient on importance of lifestyle modification including heart healthy diet, regular physical activitty.   Disposition:   FU with undersigned after tests   Signed, Almond LintAileen Eulas Schweitzer, MD  01/19/2016 1:10 PM    [facility]

## 2016-01-19 NOTE — Patient Instructions (Addendum)
Medication Instructions:  Your physician recommends that you continue on your current medications as directed. Please refer to the Current Medication list given to you today.   Labwork: None Ordered  Testing/Procedures: Your physician has requested that you have an echocardiogram. Echocardiography is a painless test that uses sound waves to create images of your heart. It provides your doctor with information about the size and shape of your heart and how well your heart's chambers and valves are working. This procedure takes approximately one hour. There are no restrictions for this procedure.  Date & Time: _________________________________________________________  Your physician has recommended that you wear an event monitor. Event monitors are medical devices that record the heart's electrical activity. Doctors most often [place] these monitors to diagnose arrhythmias. Arrhythmias are problems with the speed or rhythm of the heartbeat. The monitor is a small, portable device. You can wear one while you do your normal daily activities. This is usually used to diagnose what is causing palpitations/syncope (passing out).  Will arrive to your home  Your physician has requested that you have en exercise stress myoview. For further information please visit [email or web address] Please follow instruction sheet, as given.   Date & Time: _________Monday January 25, 2016 at 07:30AM_____________________________  Follow-Up: Your physician recommends that you schedule a follow-up appointment after testing to review results.  Date & Time:_____________________________________________________________________   Any Other Special Instructions Will Be Listed Below (If Applicable).  [facility] MYOVIEW  Your caregiver has ordered a Stress Test with nuclear imaging. The purpose of this test is to evaluate the blood supply to your heart muscle. This procedure is referred to as a "Non-Invasive Stress Test." This is  because other than having an IV started in your vein, nothing is inserted or "invades" your body. Cardiac stress tests are done to find areas of poor blood flow to the heart by determining the extent of coronary artery disease (CAD). Some patients exercise on a treadmill, which naturally increases the blood flow to your heart, while others who are  unable to walk on a treadmill due to physical limitations have a pharmacologic/chemical stress agent called Lexiscan . This medicine will mimic walking on a treadmill by temporarily increasing your coronary blood flow.   Please note: these test may take anywhere between 2-4 hours to complete  PLEASE REPORT TO [facility] MEDICAL MALL ENTRANCE  THE VOLUNTEERS AT THE FIRST DESK WILL DIRECT YOU WHERE TO GO  Date of Procedure:___Monday January 25, 2016 at 07:30 AM__________  Arrival Time for Procedure:______Arrive at 07:15 AM to register____________   PLEASE NOTIFY THE OFFICE AT LEAST 24 HOURS IN ADVANCE IF YOU ARE UNABLE TO KEEP YOUR APPOINTMENT.  [phone] AND  PLEASE NOTIFY NUCLEAR MEDICINE AT [facility] AT LEAST 24 HOURS IN ADVANCE IF YOU ARE UNABLE TO KEEP YOUR APPOINTMENT. [phone]  How to prepare for your Myoview test:   Do not eat or drink after midnight  No caffeine for 24 hours prior to test  No smoking 24 hours prior to test.  Your medication may be taken with water.  If your doctor stopped a medication because of this test, do not take that medication.  Ladies, please do not wear dresses.  Skirts or pants are appropriate. Please wear a short sleeve shirt.  No perfume, cologne or lotion.  Wear comfortable walking shoes. No heels!             If you need a refill on your cardiac medications before your next appointment, please call your pharmacy.  Echocardiogram An echocardiogram, or echocardiography, uses sound waves (ultrasound) to produce an image of your heart. The echocardiogram is simple, painless, obtained within a short  period of time, and offers valuable information to your health care provider. The images from an echocardiogram can provide information such as:  Evidence of coronary artery disease (CAD).  Heart size.  Heart muscle function.  Heart valve function.  Aneurysm detection.  Evidence of a past heart attack.  Fluid buildup around the heart.  Heart muscle thickening.  Assess heart valve function. LET [facility] CARE PROVIDER KNOW ABOUT:  Any allergies you have.  All medicines you are taking, including vitamins, herbs, eye drops, creams, and over-the-counter medicines.  Previous problems you or members of your family have had with the use of anesthetics.  Any blood disorders you have.  Previous surgeries you have had.  Medical conditions you have.  Possibility of pregnancy, if this applies. BEFORE THE PROCEDURE  No special preparation is needed. Eat and drink normally.  PROCEDURE   In order to produce an image of your heart, gel will be applied to your chest and a wand-like tool (transducer) will be moved over your chest. The gel will help transmit the sound waves from the transducer. The sound waves will harmlessly bounce off your heart to allow the heart images to be captured in real-time motion. These images will then be recorded.  You may need an IV to receive a medicine that improves the quality of the pictures. AFTER THE PROCEDURE You may return to your normal schedule including diet, activities, and medicines, unless your health care provider tells you otherwise.   This information is not intended to replace advice given to you by your health care provider. Make sure you discuss any questions you have with your health care provider.   Document Released: 10/21/2000 Document Revised: 11/14/2014 Document Reviewed: 07/01/2013 Elsevier Interactive Patient Education 2016 Elsevier Inc.   Cardiac Event Monitoring A cardiac event monitor is a small recording device used to  help detect abnormal heart rhythms (arrhythmias). The monitor is used to record heart rhythm when noticeable symptoms such as the following occur:  Fast heartbeats (palpitations), such as heart racing or fluttering.  Dizziness.  Fainting or light-headedness.  Unexplained weakness. The monitor is wired to two electrodes placed on your chest. Electrodes are flat, sticky disks that attach to your skin. The monitor can be worn for up to 30 days. You will wear the monitor at all times, except when bathing.  HOW TO USE YOUR CARDIAC EVENT MONITOR A technician will prepare your chest for the electrode placement. The technician will show you how to place the electrodes, how to work the monitor, and how to replace the batteries. Take time to practice using the monitor before you leave the office. Make sure you understand how to send the information from the monitor to your health care provider. This requires a telephone with a landline, not a cell phone. You need to:  Wear your monitor at all times, except when you are in water:  Do not get the monitor wet.  Take the monitor off when bathing. Do not swim or use a hot tub with it on.  Keep your skin clean. Do not put body lotion or moisturizer on your chest.  Change the electrodes daily or any time they stop sticking to your skin. You might need to use tape to keep them on.  It is possible that your skin under the electrodes could become irritated.  To keep this from happening, try to put the electrodes in slightly different places on your chest. However, they must remain in the area under your left breast and in the upper right section of your chest.  Make sure the monitor is safely clipped to your clothing or in a location close to your body that your health care provider recommends.  Press the button to record when you feel symptoms of heart trouble, such as dizziness, weakness, light-headedness, palpitations, thumping, shortness of breath,  unexplained weakness, or a fluttering or racing heart. The monitor is always on and records what happened slightly before you pressed the button, so do not worry about being too late to get good information.  Keep a diary of your activities, such as walking, doing chores, and taking medicine. It is especially important to note what you were doing when you pushed the button to record your symptoms. This will help your health care provider determine what might be contributing to your symptoms. The information stored in your monitor will be reviewed by your health care provider alongside your diary entries.  Send the recorded information as recommended by your health care provider. It is important to understand that it will take some time for your health care provider to process the results.  Change the batteries as recommended by your health care provider. SEEK IMMEDIATE MEDICAL CARE IF:   You have chest pain.  You have extreme difficulty breathing or shortness of breath.  You develop a very fast heartbeat that persists.  You develop dizziness that does not go away.  You faint or constantly feel you are about to faint.   This information is not intended to replace advice given to you by your health care provider. Make sure you discuss any questions you have with your health care provider.   Document Released: 08/02/2008 Document Revised: 11/14/2014 Document Reviewed: 04/22/2013 Elsevier Interactive Patient Education 2016 [employer]   Pharmacologic Stress Electrocardiogram A pharmacologic stress electrocardiogram is a heart (cardiac) test that uses nuclear imaging to evaluate the blood supply to your heart. This test may also be called a pharmacologic stress electrocardiography. Pharmacologic means that a medicine is used to increase your heart rate and blood pressure.  This stress test is done to find areas of poor blood flow to the heart by determining the extent of coronary artery  disease (CAD). Some people exercise on a treadmill, which naturally increases the blood flow to the heart. For those people unable to exercise on a treadmill, a medicine is used. This medicine stimulates your heart and will cause your heart to beat harder and more quickly, as if you were exercising.  Pharmacologic stress tests can help determine:  The adequacy of blood flow to your heart during increased levels of activity in order to clear you for discharge home.  The extent of coronary artery blockage caused by CAD.  Your prognosis if you have suffered a heart attack.  The effectiveness of cardiac procedures done, such as an angioplasty, which can increase the circulation in your coronary arteries.  Causes of chest pain or pressure. LET [facility]ER KNOW ABOUT:  Any allergies you have.  All medicines you are taking, including vitamins, herbs, eye drops, creams, and over-the-counter medicines.  Previous problems you or members of your family have had with the use of anesthetics.  Any blood disorders you have.  Previous surgeries you have had.  Medical conditions you have.  Possibility of pregnancy, if this applies.  If you are currently breastfeeding. RISKS AND COMPLICATIONS Generally, this is a safe procedure. However, as with any procedure, complications can occur. Possible complications include:  You develop pain or pressure in the following areas:  Chest.  Jaw or neck.  Between your shoulder blades.  Radiating down your left arm.  Headache.  Dizziness or light-headedness.  Shortness of breath.  Increased or irregular heartbeat.  Low blood pressure.  Nausea or vomiting.  Flushing.  Redness going up the arm and slight pain during injection of medicine.  Heart attack (rare). BEFORE THE PROCEDURE   Avoid all forms of caffeine for 24 hours before your test or as directed by your health care provider. This includes coffee, tea (even  decaffeinated tea), caffeinated sodas, chocolate, cocoa, and certain pain medicines.  Follow your health care provider's instructions regarding eating and drinking before the test.  Take your medicines as directed at regular times with water unless instructed otherwise. Exceptions may include:  If you have diabetes, ask how you are to take your insulin or pills. It is common to adjust insulin dosing the morning of the test.  If you are taking beta-blocker medicines, it is important to talk to your health care provider about these medicines well before the date of your test. Taking beta-blocker medicines may interfere with the test. In some cases, these medicines need to be changed or stopped 24 hours or more before the test.  If you wear a nitroglycerin patch, it may need to be removed prior to the test. Ask your health care provider if the patch should be removed before the test.  If you use an inhaler for any breathing condition, bring it with you to the test.  If you are an outpatient, bring a snack so you can eat right after the stress phase of the test.  Do not smoke for 4 hours prior to the test or as directed by your health care provider.  Do not apply lotions, powders, creams, or oils on your chest prior to the test.  Wear comfortable shoes and clothing. Let your health care provider know if you were unable to complete or follow the preparations for your test. PROCEDURE   Multiple patches (electrodes) will be put on your chest. If needed, small areas of your chest may be shaved to get better contact with the electrodes. Once the electrodes are attached to your body, multiple wires will be attached to the electrodes, and your heart rate will be monitored.  An IV access will be started. A nuclear trace (isotope) is given. The isotope may be given intravenously, or it may be swallowed. Nuclear refers to several types of radioactive isotopes, and the nuclear isotope lights up the  arteries so that the nuclear images are clear. The isotope is absorbed by your body. This results in low radiation exposure.  A resting nuclear image is taken to show how your heart functions at rest.  A medicine is given through the IV access.  A second scan is done about 1 hour after the medicine injection and determines how your heart functions under stress.  During this stress phase, you will be connected to an electrocardiogram machine. Your blood pressure and oxygen levels will be monitored. AFTER THE PROCEDURE   Your heart rate and blood pressure will be monitored after the test.  You may return to your normal schedule, including diet,activities, and medicines, unless your health care provider tells you otherwise.   This information is not intended to replace  advice given to you by your health care provider. Make sure you discuss any questions you have with your health care provider.   Document Released: 03/12/2009 Document Revised: 10/29/2013 Document Reviewed: 07/01/2013 Elsevier Interactive Patient Education [employer]

## 2016-01-20 ENCOUNTER — Telehealth: Payer: Self-pay | Admitting: Cardiology

## 2016-01-20 NOTE — Telephone Encounter (Signed)
Pt spouse called and would like to cancel pt stress test. Please call.

## 2016-01-20 NOTE — Telephone Encounter (Signed)
Patients husband reports that his wife wants to cancel all testing at this time and wait to see her current physicians. Offered to reschedule and he declined and stated that his wife just wants to follow up with her current physicians before moving forward with any testing. He states that she does not want to do the holter monitor, echo, stress test, or follow up appointment at this time. Told him to please feel free to call if they change their mind or need any additional services. He verbalized understanding and had no further questions at this time.

## 2016-01-20 NOTE — Telephone Encounter (Signed)
Left message on voicemail.

## 2016-01-25 ENCOUNTER — Other Ambulatory Visit: Payer: Medicare Other

## 2016-02-03 DIAGNOSIS — I639 Cerebral infarction, unspecified: Secondary | ICD-10-CM | POA: Diagnosis not present

## 2016-02-05 ENCOUNTER — Other Ambulatory Visit: Payer: Medicare Other

## 2016-02-12 ENCOUNTER — Ambulatory Visit: Payer: Self-pay | Admitting: Cardiovascular Disease

## 2016-02-17 ENCOUNTER — Encounter: Payer: Self-pay | Admitting: Family Medicine

## 2016-02-17 ENCOUNTER — Ambulatory Visit (INDEPENDENT_AMBULATORY_CARE_PROVIDER_SITE_OTHER): Payer: Medicare Other | Admitting: Family Medicine

## 2016-02-17 VITALS — BP 104/64 | HR 64 | Temp 97.5°F | Resp 16 | Ht 64.5 in | Wt 166.0 lb

## 2016-02-17 DIAGNOSIS — I10 Essential (primary) hypertension: Secondary | ICD-10-CM

## 2016-02-17 DIAGNOSIS — I444 Left anterior fascicular block: Secondary | ICD-10-CM | POA: Diagnosis not present

## 2016-02-17 DIAGNOSIS — I639 Cerebral infarction, unspecified: Secondary | ICD-10-CM

## 2016-02-17 DIAGNOSIS — E785 Hyperlipidemia, unspecified: Secondary | ICD-10-CM

## 2016-02-17 DIAGNOSIS — R7309 Other abnormal glucose: Secondary | ICD-10-CM | POA: Diagnosis not present

## 2016-02-17 MED ORDER — AMLODIPINE BESYLATE 2.5 MG PO TABS: 2.5000 mg | ORAL_TABLET | Freq: Every day | ORAL | Status: DC

## 2016-02-17 NOTE — Progress Notes (Deleted)
[name], Female    DOB: 07/31/1946, 70 y.o.   MRN: [id number] Visit Date: 02/17/2016  Today's Provider: Lorie Phenix, MD   Chief Complaint  Patient presents with  . Medicare Wellness   Subjective:    Annual wellness visit Michele Lawrence is a 70 y.o. female. She feels fairly well. She reports exercising daily; walks for 30 minutes. She reports she is sleeping well.  -----------------------------------------------------------   Review of Systems  Social History   Social History  . Marital Status: Married    Spouse Name: N/A  . Number of Children: N/A  . Years of Education: N/A   Occupational History  . Not on file.   Social History Main Topics  . Smoking status: Never Smoker   . Smokeless tobacco: Never Used  . Alcohol Use: Yes     Comment: 1/2 glass of wine qhs  . Drug Use: No  . Sexual Activity: Not on file   Other Topics Concern  . Not on file   Social History Narrative    Past Medical History  Diagnosis Date  . Hypertension   . Asthma   . Stroke (HCC)   . Hyperlipidemia      Patient Active Problem List   Diagnosis Date Noted  . Palpitations 01/19/2016  . Shortness of breath 01/19/2016  . 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 Hyperlipidemia 01/19/2016  . Ischemic stroke (HCC) 12/14/2015  . Need for vaccination 07/17/2015  . Subclinical hypothyroidism 07/17/2015  . Calcium blood increased 07/17/2015  . Belching 07/17/2015  . Airway hyperreactivity 07/17/2015  . Allergic rhinitis 07/17/2015  . Abnormal blood sugar 02/18/2010  . Herpes zona 06/15/2009  . Family history of breast cancer 12/22/2007  . Benign hypertension 12/21/2007  . HLD (hyperlipidemia) 04/17/2007    No past surgical history on file.  Her family history includes Alcohol abuse in her father; CVA in her mother; Cancer in her mother; Cataracts in her mother; Diabetes in her mother; Heart disease in her sister; Psoriasis in her mother;  Stroke in her mother.    Previous Medications   ACETAMINOPHEN (TYLENOL) 500 MG TABLET    Take 500 mg by mouth every 6 (six) hours as needed for moderate pain or headache.    AMLODIPINE (NORVASC) 2.5 MG TABLET    Take 1 tablet (2.5 mg total) by mouth daily. Dose change. Please notify patient. Thanks.   ASPIRIN 81 MG TABLET    Take 81 mg by mouth daily.    ATORVASTATIN (LIPITOR) 80 MG TABLET    Take 1 tablet (80 mg total) by mouth daily at 6 PM.   IBUPROFEN (ADVIL) 200 MG TABLET    Take by mouth. 1 tablet q 4-6 hours PRN   LORATADINE (CLARITIN) 10 MG TABLET    Take 1 tablet by mouth daily.   LOSARTAN (COZAAR) 50 MG TABLET    TAKE ONE TABLET BY MOUTH ONCE DAILY   MULTIPLE VITAMINS-MINERALS PO    Take 1 tablet by mouth daily.   VENTOLIN HFA 108 (90 BASE) MCG/ACT INHALER    INHALE 2 PUFFS EVERY 4-6 HOURS AS NEEDED    Patient Care Team: Lorie Phenix, MD as PCP - General (Family Medicine)     Objective:   Vitals: There were no vitals taken for this visit.  Physical Exam  Activities of Daily Living No flowsheet data found.  Fall Risk Assessment No flowsheet data found.   Depression Screen No flowsheet data found.  Cognitive Testing - 6-CIT  Correct? Score   What year is it? {yes no:22349} {0-4:31231} 0 or 4  What month is it? {yes no:22349} {0-3:21082} 0 or 3  Memorize:    Floyde ParkinsJohn,  [place],  40,  [address],  [place],      What time is it? (within 1 hour) {yes no:22349} {0-3:21082} 0 or 3  Count backwards from 20 {yes no:22349} {0-4:31231} 0, 2, or 4  Name the months of the year {yes no:22349} {0-4:31231} 0, 2, or 4  Repeat name & address above {yes no:22349} {0-10:5044} 0, 2, 4, 6, 8, or 10       TOTAL SCORE  ***/28   Interpretation:  {normal/abnormal:11317::"Normal"}  Normal (0-7) Abnormal (8-28)       Assessment & Plan:     Annual Wellness Visit  Reviewed patient's Family Medical History Reviewed and updated list of patient's medical providers Assessment of cognitive  impairment was done Assessed patient's functional ability Established a written schedule for health screening services Health Risk Assessent Completed and Reviewed  Exercise Activities and Dietary recommendations Goals    None      Immunization History  Administered Date(s) Administered  . Pneumococcal Polysaccharide-23 02/18/2010  . Tdap 12/04/2009  . Zoster 08/24/2010    Health Maintenance  Topic Date Due  . Hepatitis C Screening  1946-09-06  . MAMMOGRAM  10/26/1994  . COLONOSCOPY  10/26/1994  . DEXA SCAN  10/26/2009  . PNA vac Low Risk Adult (2 of 2 - PCV13) 02/19/2011  . INFLUENZA VACCINE  06/07/2016  . TETANUS/TDAP  12/05/2019  . ZOSTAVAX  Completed      Discussed health benefits of physical activity, and encouraged her to engage in regular exercise appropriate for her age and condition.    ------------------------------------------------------------------------------------------------------------

## 2016-02-17 NOTE — Progress Notes (Signed)
Subjective:    Patient ID: Michele Lawrence, female    DOB: [birth year], 70 y.o.   MRN: [id number]  Hypertension This is a chronic problem. The problem has been gradually worsening (pt c/o hypotension) since onset. Associated symptoms include malaise/fatigue. Pertinent negatives include no anxiety, blurred vision, chest pain, headaches, neck pain, orthopnea, palpitations, peripheral edema, shortness of breath or sweats. (Pt also c/o dizziness, lightheadedness with position change) Risk factors for coronary artery disease include dyslipidemia, post-menopausal state and family history. Treatments tried: currently taking Amlodipine 5 mg, Losartan 50 mg. There are no compliance problems.    Has had a stroke and is undergoing cardiac work up.      Review of Systems  Constitutional: Positive for malaise/fatigue.  Eyes: Negative for blurred vision.  Respiratory: Negative for shortness of breath.   Cardiovascular: Negative for chest pain, palpitations and orthopnea.  Musculoskeletal: Negative for neck pain.  Neurological: Positive for dizziness and light-headedness. Negative for headaches.   BP 104/64 mmHg  Pulse 64  Temp(Src) 97.5 F (36.4 C) (Oral)  Resp 16  Ht 5' 4.5" (1.638 m)  Wt 166 lb (75.297 kg)  BMI 28.06 kg/m2   Patient Active Problem List   Diagnosis Date Noted  . Palpitations 01/19/2016  . Shortness of breath 01/19/2016  . 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 Hyperlipidemia 01/19/2016  . Ischemic stroke (HCC) 12/14/2015  . Need for vaccination 07/17/2015  . Subclinical hypothyroidism 07/17/2015  . Calcium blood increased 07/17/2015  . Belching 07/17/2015  . Airway hyperreactivity 07/17/2015  . Allergic rhinitis 07/17/2015  . Abnormal blood sugar 02/18/2010  . Herpes zona 06/15/2009  . Family history of breast cancer 12/22/2007  . Benign hypertension 12/21/2007  . HLD (hyperlipidemia) 04/17/2007   Past Medical History  Diagnosis Date  .  Hypertension   . Asthma   . Stroke (HCC)   . Hyperlipidemia    Current Outpatient Prescriptions on File Prior to Visit  Medication Sig  . acetaminophen (TYLENOL) 500 MG tablet Take 500 mg by mouth every 6 (six) hours as needed for moderate pain or headache.   Marland Kitchen. amLODipine (NORVASC) 2.5 MG tablet Take 1 tablet (2.5 mg total) by mouth daily. Dose change. Please notify patient. Thanks. (Patient taking differently: Take 5 mg by mouth daily. Dose change. Please notify patient. Thanks.)  . aspirin 81 MG tablet Take 81 mg by mouth daily.   Marland Kitchen. ibuprofen (ADVIL) 200 MG tablet Take by mouth. 1 tablet q 4-6 hours PRN  . losartan (COZAAR) 50 MG tablet TAKE ONE TABLET BY MOUTH ONCE DAILY  . MULTIPLE VITAMINS-MINERALS PO Take 1 tablet by mouth daily.  . VENTOLIN HFA 108 (90 BASE) MCG/ACT inhaler INHALE 2 PUFFS EVERY 4-6 HOURS AS NEEDED  . atorvastatin (LIPITOR) 80 MG tablet Take 1 tablet (80 mg total) by mouth daily at 6 PM.  . loratadine (CLARITIN) 10 MG tablet Take 1 tablet by mouth daily. Reported on 02/17/2016   No current facility-administered medications on file prior to visit.   Allergies  Allergen Reactions  . Penicillins    No past surgical history on file. Social History   Social History  . Marital Status: Married    Spouse Name: N/A  . Number of Children: N/A  . Years of Education: N/A   Occupational History  . Not on file.   Social History Main Topics  . Smoking status: Never Smoker   . Smokeless tobacco: Never Used  . Alcohol Use: Yes  Comment: 1/2 glass of wine qhs  . Drug Use: No  . Sexual Activity: Not on file   Other Topics Concern  . Not on file   Social History Narrative   Family History  Problem Relation Age of Onset  . Cancer Mother     breast  . Diabetes Mother   . Stroke Mother   . CVA Mother   . Cataracts Mother   . Psoriasis Mother   . Alcohol abuse Father   . Heart disease Sister       Objective:   Physical Exam  Constitutional: She appears  well-developed and well-nourished.  Cardiovascular: Normal rate, regular rhythm and normal heart sounds.   Pulmonary/Chest: Effort normal and breath sounds normal. No respiratory distress.  Psychiatric: She has a normal mood and affect. Her behavior is normal.       Assessment & Plan:  1. Benign hypertension Pt experiencing hypotension. Decrease Amlodipine from 5 mg. Pt will hold Norvasc until her BP is elevated. Will then take Norvasc 2.5 mg. Will continue to monitor. - amLODipine (NORVASC) 2.5 MG tablet; Take 1 tablet (2.5 mg total) by mouth daily. Dose change. Please notify patient. Thanks.  Dispense: 90 tablet; Refill: 3  2. Ischemic stroke (HCC) Pt did not FU with cardiology due to pt's experience with previous cardiologist. Advised pt to FU to proceed with work up. Referral placed as below. - Ambulatory referral to Cardiology  3. LAFB (left anterior fascicular block) Refer to cardiology as above. Does need an event monitor.    4. Hyperlipidemia Stable.  Will check labs.   - Lipid panel - Comprehensive metabolic panel - CBC with Differential/Platelet  5. Abnormal blood sugar Pt has H/O this. FU pending results. - Hemoglobin A1c    Patient seen and examined by Leo Grosser, MD, and note scribed by Allene Dillon, CMA.  I have reviewed the document for accuracy and completeness and I agree with above. Leo Grosser, MD   Lorie Phenix, MD

## 2016-02-18 ENCOUNTER — Telehealth: Payer: Self-pay

## 2016-02-18 DIAGNOSIS — R748 Abnormal levels of other serum enzymes: Secondary | ICD-10-CM

## 2016-02-18 LAB — CBC WITH DIFFERENTIAL/PLATELET
BASOS ABS: 0.1 10*3/uL (ref 0.0–0.2)
Basos: 1 %
EOS (ABSOLUTE): 0.4 10*3/uL (ref 0.0–0.4)
Eos: 5 %
HEMOGLOBIN: 11.4 g/dL (ref 11.1–15.9)
Hematocrit: 36.5 % (ref 34.0–46.6)
Immature Grans (Abs): 0 10*3/uL (ref 0.0–0.1)
Immature Granulocytes: 0 %
LYMPHS ABS: 1.2 10*3/uL (ref 0.7–3.1)
Lymphs: 18 %
MCH: 26.4 pg — AB (ref 26.6–33.0)
MCHC: 31.2 g/dL — AB (ref 31.5–35.7)
MCV: 85 fL (ref 79–97)
MONOS ABS: 0.7 10*3/uL (ref 0.1–0.9)
Monocytes: 11 %
NEUTROS ABS: 4.3 10*3/uL (ref 1.4–7.0)
Neutrophils: 65 %
PLATELETS: 299 10*3/uL (ref 150–379)
RBC: 4.32 x10E6/uL (ref 3.77–5.28)
RDW: 16.5 % — AB (ref 12.3–15.4)
WBC: 6.6 10*3/uL (ref 3.4–10.8)

## 2016-02-18 LAB — LIPID PANEL
CHOL/HDL RATIO: 2.6 ratio (ref 0.0–4.4)
Cholesterol, Total: 203 mg/dL — ABNORMAL HIGH (ref 100–199)
HDL: 79 mg/dL (ref >39)
LDL Calculated: 110 mg/dL — ABNORMAL HIGH (ref 0–99)
TRIGLYCERIDES: 72 mg/dL (ref 0–149)
VLDL CHOLESTEROL CAL: 14 mg/dL (ref 5–40)

## 2016-02-18 LAB — COMPREHENSIVE METABOLIC PANEL
A/G RATIO: 1.8 (ref 1.2–2.2)
ALBUMIN: 4.4 g/dL (ref 3.5–4.8)
ALK PHOS: 1139 IU/L — AB (ref 39–117)
ALT: 359 IU/L — ABNORMAL HIGH (ref 0–32)
AST: 503 IU/L (ref 0–40)
BILIRUBIN TOTAL: 1.6 mg/dL — AB (ref 0.0–1.2)
BUN / CREAT RATIO: 17 (ref 12–28)
BUN: 19 mg/dL (ref 8–27)
CHLORIDE: 101 mmol/L (ref 96–106)
CO2: 22 mmol/L (ref 18–29)
Calcium: 10 mg/dL (ref 8.7–10.3)
Creatinine, Ser: 1.13 mg/dL — ABNORMAL HIGH (ref 0.57–1.00)
GFR calc non Af Amer: 49 mL/min/{1.73_m2} — ABNORMAL LOW (ref >59)
GFR, EST AFRICAN AMERICAN: 57 mL/min/{1.73_m2} — AB (ref >59)
Globulin, Total: 2.5 g/dL (ref 1.5–4.5)
Glucose: 124 mg/dL — ABNORMAL HIGH (ref 65–99)
POTASSIUM: 4.1 mmol/L (ref 3.5–5.2)
Sodium: 142 mmol/L (ref 134–144)
TOTAL PROTEIN: 6.9 g/dL (ref 6.0–8.5)

## 2016-02-18 LAB — HEMOGLOBIN A1C
ESTIMATED AVERAGE GLUCOSE: 128 mg/dL
Hgb A1c MFr Bld: 6.1 % — ABNORMAL HIGH (ref 4.8–5.6)

## 2016-02-18 NOTE — Telephone Encounter (Signed)
Pt advised.  Lab sheet is at the front desk for pt to pick up.   Thanks,   -Vernona RiegerLaura

## 2016-02-18 NOTE — Telephone Encounter (Signed)
-----   Message from Lorie PhenixNancy Maloney, MD sent at 02/18/2016  7:10 AM EDT ----- Labs show that liver enzymes are elevated. May be related to Lipitor.  Would stop for now, recheck in 1 week.  Then we may restart at 40 mg. Thanks.

## 2016-03-08 ENCOUNTER — Ambulatory Visit (INDEPENDENT_AMBULATORY_CARE_PROVIDER_SITE_OTHER): Payer: Medicare Other | Admitting: Family Medicine

## 2016-03-08 ENCOUNTER — Encounter: Payer: Self-pay | Admitting: Family Medicine

## 2016-03-08 VITALS — BP 94/62 | HR 84 | Temp 98.4°F | Resp 16 | Wt 161.0 lb

## 2016-03-08 DIAGNOSIS — I959 Hypotension, unspecified: Secondary | ICD-10-CM

## 2016-03-08 DIAGNOSIS — I10 Essential (primary) hypertension: Secondary | ICD-10-CM | POA: Diagnosis not present

## 2016-03-08 DIAGNOSIS — R748 Abnormal levels of other serum enzymes: Secondary | ICD-10-CM | POA: Diagnosis not present

## 2016-03-08 DIAGNOSIS — I639 Cerebral infarction, unspecified: Secondary | ICD-10-CM | POA: Diagnosis not present

## 2016-03-08 NOTE — Progress Notes (Signed)
Patient ID: Michele Lawrence, female   DOB: 10-24-1946, 70 y.o.   MRN: [id number]        [name] Female    DOB: 07-18-1946   70 y.o.   MRN: [id number] Visit Date: 03/08/2016  Today's Provider: Margarita Rana, MD   Chief Complaint  Patient presents with  . Hypertension   Subjective:    Hypertension This is a chronic problem. The problem is uncontrolled (Pt reports her blood pressure is running too low. ). Associated symptoms include malaise/fatigue. Pertinent negatives include no blurred vision, chest pain, headaches, neck pain, palpitations, peripheral edema, PND, shortness of breath or sweats. There are no known risk factors for coronary artery disease. There are no compliance problems.    Patient with unusual hypotension and overwhelming fatigue since CVA.   Occasionally feels well, but mostly not. Just exhausted all the time. Has continued low BP.   Down to one BP medication and still running low. Here with her husband today. She is just not herself . Has not been this way before. Does have some nausea and weight loss. No vomiting.  No abdominal pain.       Allergies  Allergen Reactions  . Penicillins    Previous Medications   ACETAMINOPHEN (TYLENOL) 500 MG TABLET    Take 500 mg by mouth every 6 (six) hours as needed for moderate pain or headache.    AMLODIPINE (NORVASC) 2.5 MG TABLET    Take 1 tablet (2.5 mg total) by mouth daily. Dose change. Please notify patient. Thanks.   ASPIRIN 81 MG TABLET    Take 81 mg by mouth daily.    ATORVASTATIN (LIPITOR) 80 MG TABLET    Take 1 tablet (80 mg total) by mouth daily at 6 PM.   IBUPROFEN (ADVIL) 200 MG TABLET    Take by mouth. 1 tablet q 4-6 hours PRN   LORATADINE (CLARITIN) 10 MG TABLET    Take 1 tablet by mouth daily. Reported on 03/08/2016   LOSARTAN (COZAAR) 50 MG TABLET    TAKE ONE TABLET BY MOUTH ONCE DAILY   MULTIPLE VITAMINS-MINERALS PO    Take 1 tablet by mouth daily.   VENTOLIN HFA 108 (90 BASE) MCG/ACT  INHALER    INHALE 2 PUFFS EVERY 4-6 HOURS AS NEEDED    Review of Systems  Constitutional: Positive for chills, malaise/fatigue, appetite change (Pt reports not having an appetite.) and fatigue. Negative for fever, diaphoresis, activity change and unexpected weight change.  Eyes: Negative for blurred vision.  Respiratory: Negative.  Negative for shortness of breath.   Cardiovascular: Negative.  Negative for chest pain, palpitations and PND.  Gastrointestinal: Negative.   Endocrine: Positive for cold intolerance. Negative for heat intolerance, polydipsia, polyphagia and polyuria.  Musculoskeletal: Negative for neck pain.  Neurological: Positive for weakness and light-headedness. Negative for dizziness, tremors, seizures, syncope, numbness and headaches.    Social History  Substance Use Topics  . Smoking status: Never Smoker   . Smokeless tobacco: Never Used  . Alcohol Use: Yes     Comment: 1/2 glass of wine qhs   Objective:   BP 94/62 mmHg  Pulse 84  Temp(Src) 98.4 F (36.9 C) (Oral)  Resp 16  Wt 161 lb (73.029 kg)  Physical Exam  Constitutional: She is oriented to person, place, and time. She appears well-developed and well-nourished.  Cardiovascular: Normal rate and regular rhythm.   Pulmonary/Chest: Effort normal and breath sounds normal.  Neurological: She is alert and oriented to person,  place, and time.  Skin: Skin is warm and dry.  Psychiatric: She has a normal mood and affect. Her behavior is normal. Judgment and thought content normal.      Assessment & Plan:      1. Hypotension, unspecified hypotension type New problem  May just be related to weight loss. Has lost about 8 pounds since last year.  Blood pressures have been running low and etiology is unclear.  Will check labs as below.  She is still not taking Amlodipine 2.62m secondary to low BP. ; Advised pt to stop Losartan   as well.  Will continue to monitor blood pressure.  - ACTH - Cortisol-am, blood -  TSH - Aldosterone + renin activity w/ ratio - Sed Rate (ESR)  2. Benign hypertension Seems to be resolved.  Unusual presentation. Will check labs as noted.    - ACTH - Cortisol-am, blood - TSH - Aldosterone + renin activity w/ ratio - Sed Rate (ESR)  Patient was seen and examined by NJerrell Belfast MD, and note scribed by LAshley Royalty CMA.   I have reviewed the document for accuracy and completeness and I agree with above. - NJerrell Belfast MD      NMargarita Rana MD  [facility]

## 2016-03-09 ENCOUNTER — Telehealth: Payer: Self-pay | Admitting: Family Medicine

## 2016-03-09 DIAGNOSIS — I10 Essential (primary) hypertension: Secondary | ICD-10-CM | POA: Diagnosis not present

## 2016-03-09 DIAGNOSIS — R7989 Other specified abnormal findings of blood chemistry: Secondary | ICD-10-CM | POA: Insufficient documentation

## 2016-03-09 DIAGNOSIS — R634 Abnormal weight loss: Secondary | ICD-10-CM | POA: Insufficient documentation

## 2016-03-09 DIAGNOSIS — R945 Abnormal results of liver function studies: Secondary | ICD-10-CM | POA: Insufficient documentation

## 2016-03-09 DIAGNOSIS — I959 Hypotension, unspecified: Secondary | ICD-10-CM | POA: Diagnosis not present

## 2016-03-09 DIAGNOSIS — R5383 Other fatigue: Secondary | ICD-10-CM | POA: Insufficient documentation

## 2016-03-09 DIAGNOSIS — R748 Abnormal levels of other serum enzymes: Secondary | ICD-10-CM

## 2016-03-09 LAB — COMPREHENSIVE METABOLIC PANEL
ALBUMIN: 3.9 g/dL (ref 3.5–4.8)
ALK PHOS: 1037 IU/L — AB (ref 39–117)
ALT: 255 IU/L — ABNORMAL HIGH (ref 0–32)
AST: 403 IU/L — AB (ref 0–40)
Albumin/Globulin Ratio: 1.6 (ref 1.2–2.2)
BILIRUBIN TOTAL: 1.6 mg/dL — AB (ref 0.0–1.2)
BUN / CREAT RATIO: 14 (ref 12–28)
BUN: 18 mg/dL (ref 8–27)
CHLORIDE: 102 mmol/L (ref 96–106)
CO2: 23 mmol/L (ref 18–29)
CREATININE: 1.25 mg/dL — AB (ref 0.57–1.00)
Calcium: 10.2 mg/dL (ref 8.7–10.3)
GFR calc Af Amer: 50 mL/min/{1.73_m2} — ABNORMAL LOW (ref >59)
GFR calc non Af Amer: 43 mL/min/{1.73_m2} — ABNORMAL LOW (ref >59)
GLOBULIN, TOTAL: 2.4 g/dL (ref 1.5–4.5)
GLUCOSE: 102 mg/dL — AB (ref 65–99)
Potassium: 4.7 mmol/L (ref 3.5–5.2)
SODIUM: 142 mmol/L (ref 134–144)
Total Protein: 6.3 g/dL (ref 6.0–8.5)

## 2016-03-09 NOTE — Telephone Encounter (Signed)
Pt states she is returning a call to Dr Elease HashimotoMaloney.  EA#[phone]/NWCB#[phone]/MW

## 2016-03-10 ENCOUNTER — Telehealth: Payer: Self-pay

## 2016-03-10 NOTE — Telephone Encounter (Signed)
-----   Message from Lorie PhenixNancy Maloney, MD sent at 03/09/2016  9:43 AM EDT ----- LMTCB. Liver enzymes very elevated still recommend abdominal CT to evaluate.  Please notify patient then scheduled. Thanks.

## 2016-03-16 ENCOUNTER — Ambulatory Visit: Payer: Medicare Other

## 2016-03-17 ENCOUNTER — Telehealth: Payer: Self-pay

## 2016-03-17 LAB — SEDIMENTATION RATE: Sed Rate: 2 mm/hr (ref 0–40)

## 2016-03-17 LAB — ALDOSTERONE + RENIN ACTIVITY W/ RATIO
ALDOS/RENIN RATIO: 1.6 (ref 0.0–30.0)
ALDOSTERONE: 17.2 ng/dL (ref 0.0–30.0)
Renin: 11.046 ng/mL/hr — ABNORMAL HIGH (ref 0.167–5.380)

## 2016-03-17 LAB — TSH: TSH: 3.62 u[IU]/mL (ref 0.450–4.500)

## 2016-03-17 LAB — ACTH: ACTH: 28.2 pg/mL (ref 7.2–63.3)

## 2016-03-17 LAB — CORTISOL-AM, BLOOD: CORTISOL - AM: 26.1 ug/dL — AB (ref 6.2–19.4)

## 2016-03-17 NOTE — Telephone Encounter (Signed)
Left message to call back  

## 2016-03-17 NOTE — Telephone Encounter (Signed)
-----   Message from Lorie PhenixNancy Maloney, MD sent at 03/17/2016  2:00 PM EDT ----- Labs do not show adrenal insufficiency. Please see how patient is doing and recheck at follow up. Thanks.

## 2016-03-18 ENCOUNTER — Encounter: Payer: Self-pay | Admitting: Family Medicine

## 2016-03-18 ENCOUNTER — Ambulatory Visit
Admission: RE | Admit: 2016-03-18 | Discharge: 2016-03-18 | Disposition: A | Discharge status: Home / Self Care | Payer: Medicare Other | Source: Ambulatory Visit | Attending: Family Medicine | Admitting: Family Medicine

## 2016-03-18 ENCOUNTER — Ambulatory Visit (INDEPENDENT_AMBULATORY_CARE_PROVIDER_SITE_OTHER): Payer: Medicare Other | Admitting: Family Medicine

## 2016-03-18 ENCOUNTER — Ambulatory Visit: Payer: Medicare Other | Admitting: Family Medicine

## 2016-03-18 VITALS — BP 128/76 | HR 72 | Temp 97.7°F | Resp 16 | Wt 162.0 lb

## 2016-03-18 DIAGNOSIS — I639 Cerebral infarction, unspecified: Secondary | ICD-10-CM | POA: Diagnosis not present

## 2016-03-18 DIAGNOSIS — R5383 Other fatigue: Secondary | ICD-10-CM | POA: Diagnosis not present

## 2016-03-18 DIAGNOSIS — K449 Diaphragmatic hernia without obstruction or gangrene: Secondary | ICD-10-CM | POA: Insufficient documentation

## 2016-03-18 DIAGNOSIS — I10 Essential (primary) hypertension: Secondary | ICD-10-CM | POA: Diagnosis not present

## 2016-03-18 DIAGNOSIS — R945 Abnormal results of liver function studies: Secondary | ICD-10-CM

## 2016-03-18 DIAGNOSIS — R7989 Other specified abnormal findings of blood chemistry: Secondary | ICD-10-CM | POA: Insufficient documentation

## 2016-03-18 DIAGNOSIS — R634 Abnormal weight loss: Secondary | ICD-10-CM | POA: Insufficient documentation

## 2016-03-18 MED ORDER — IOPAMIDOL (ISOVUE-300) INJECTION 61%
80.0000 mL | Freq: Once | INTRAVENOUS | Status: AC | PRN
  Administered 2016-03-18: 80 mL via INTRAVENOUS

## 2016-03-18 NOTE — Telephone Encounter (Signed)
LMTCB

## 2016-03-18 NOTE — Progress Notes (Signed)
Patient ID: Michele Lawrence, female   DOB: [birth year], 70 y.o.   MRN: [id number]         [name] Female    DOB: 08-14-1946   70 y.o.   MRN: [id number] Visit Date: 03/18/2016  Today's Provider: Lorie Phenix, MD   Chief Complaint  Patient presents with  . Hypertension  . Hypotension   Subjective:    HPI     Hypertension, follow-up:  BP Readings from Last 3 Encounters:  03/18/16 128/76  03/08/16 94/62  02/17/16 104/64    She was last seen for hypertension 4 weeks ago.  Management since that visit includes Stopping all blood pressure medications.She reports excellent compliance with treatment. Pt reports feeling much better since coming off all her blood pressure medicines.  She is not having side effects.  She is exercising. She is adherent to low salt diet.   She is experiencing none.  Patient denies chest pain, chest pressure/discomfort, fatigue, lower extremity edema, palpitations and syncope.   Cardiovascular risk factors include advanced age (older than 28 for men, 31 for women).   ------------------------------------------------------------------------     Allergies  Allergen Reactions  . Penicillins    Previous Medications   ACETAMINOPHEN (TYLENOL) 500 MG TABLET    Take 500 mg by mouth every 6 (six) hours as needed for moderate pain or headache. Reported on 03/18/2016   AMLODIPINE (NORVASC) 2.5 MG TABLET    Take 1 tablet (2.5 mg total) by mouth daily. Dose change. Please notify patient. Thanks.   ASPIRIN 81 MG TABLET    Take 81 mg by mouth daily.    ATORVASTATIN (LIPITOR) 80 MG TABLET    Take 1 tablet (80 mg total) by mouth daily at 6 PM.   IBUPROFEN (ADVIL) 200 MG TABLET    Take by mouth. Reported on 03/18/2016   LORATADINE (CLARITIN) 10 MG TABLET    Take 1 tablet by mouth daily. Reported on 03/18/2016   LOSARTAN (COZAAR) 50 MG TABLET    TAKE ONE TABLET BY MOUTH ONCE DAILY   MULTIPLE VITAMINS-MINERALS PO    Take 1 tablet by mouth daily.     VENTOLIN HFA 108 (90 BASE) MCG/ACT INHALER    INHALE 2 PUFFS EVERY 4-6 HOURS AS NEEDED    Review of Systems  Constitutional: Negative.   Respiratory: Negative.   Cardiovascular: Negative.   Gastrointestinal: Negative.   Musculoskeletal: Negative.   Neurological: Positive for light-headedness (Only rarely if she stands up too fast. ). Negative for dizziness, tremors, weakness, numbness and headaches.    Social History  Substance Use Topics  . Smoking status: Never Smoker   . Smokeless tobacco: Never Used  . Alcohol Use: Yes     Comment: 1/2 glass of wine qhs   Objective:   BP 128/76 mmHg  Pulse 72  Temp(Src) 97.7 F (36.5 C) (Oral)  Resp 16  Wt 162 lb (73.483 kg)  Physical Exam  Constitutional: She is oriented to person, place, and time. She appears well-developed and well-nourished.  Neurological: She is alert and oriented to person, place, and time.  Skin: Skin is warm and dry.  Psychiatric: She has a normal mood and affect. Her behavior is normal. Judgment and thought content normal.        Assessment & Plan:      1. Benign hypertension Resolved right now; Pt reports feeling much better off her BP medications.  Advised pt to continue to monitor blood pressure at home.  Recheck in six  months with Antony ContrasJenni. Call sooner if BP starts to become elevated. ALso notified of results of normal CT scan at [facility].    Patient was seen and examined by Leo GrosserNancy J. Millicent Blazejewski, MD, and note scribed by Kavin LeechLaura Walsh, CMA.  I have reviewed the document for accuracy and completeness and I agree with above. - Leo GrosserNancy J. Davinia Riccardi, MD      Lorie PhenixNancy Britanni Yarde, MD  [facility]

## 2016-03-21 ENCOUNTER — Ambulatory Visit: Payer: Medicare Other | Admitting: Family Medicine

## 2016-03-21 NOTE — Telephone Encounter (Signed)
Advised pt of lab results. Pt verbally acknowledges understanding. Emily Drozdowski, CMA   

## 2016-03-28 ENCOUNTER — Encounter: Payer: Self-pay | Admitting: Cardiovascular Disease

## 2016-03-28 ENCOUNTER — Encounter (INDEPENDENT_AMBULATORY_CARE_PROVIDER_SITE_OTHER): Payer: Self-pay

## 2016-03-28 ENCOUNTER — Ambulatory Visit (INDEPENDENT_AMBULATORY_CARE_PROVIDER_SITE_OTHER): Payer: Medicare Other | Admitting: Cardiovascular Disease

## 2016-03-28 VITALS — BP 134/72 | HR 80 | Ht 65.0 in | Wt 160.3 lb

## 2016-03-28 DIAGNOSIS — I639 Cerebral infarction, unspecified: Secondary | ICD-10-CM | POA: Diagnosis not present

## 2016-03-28 NOTE — Progress Notes (Signed)
Cardiology Office Note   Date:  03/28/2016   ID:  Michele LinesMaureen A Dancel, DOB 09/12/1946, MRN [id number]  PCP:  Lorie PhenixNancy Maloney, MD  Cardiologist:   Lorine BearsMuhammad Avigail Pilling, MD   Chief Complaint  Patient presents with  . other    Consult. Meds reviewed verbally .      History of Present Illness: Michele LinesMaureen A Cristo is a 70 y.o. female who presents for a second opinion. She has known history of hypertension and hyperlipidemia. [facility] with left sided weakness and was given TPA. She was suspected of having a small stroke. CT of the chest showed possible hypodensity in the right MCA distribution. However, MRI of the brain was normal. The patient recovered completely. Carotid imaging was overall unremarkable and the exact etiology for her presentation was not entirely clear. She was seen by Dr. Alvino ChapelIngal for evaluation. There was mention of palpitations and shortness of breath and thus the patient was scheduled for a stress test, echocardiogram and an event monitor to detect atrial fibrillation. The patient did not get these tests done. She was feeling poorly recently with dizziness. She was on amlodipine and losartan. Both of these medications were discontinued and since then she has felt 100% better with resolution of all her symptoms. [place] and was able to walk without any limitations. No chest pain or shortness of breath. She currently denies palpitations. She reports being told about a heart murmur when she was 70 years old.   Past Medical History  Diagnosis Date  . Hypertension   . Asthma   . Stroke (HCC)   . Hyperlipidemia     History reviewed. No pertinent past surgical history.   Current Outpatient Prescriptions  Medication Sig Dispense Refill  . acetaminophen (TYLENOL) 500 MG tablet Take 500 mg by mouth every 6 (six) hours as needed for moderate pain or headache. Reported on 03/18/2016    . aspirin 81 MG  tablet Take 81 mg by mouth as needed.     Marland Kitchen. atorvastatin (LIPITOR) 80 MG tablet Take 1 tablet (80 mg total) by mouth daily at 6 PM. 90 tablet 3  . ibuprofen (ADVIL) 200 MG tablet Take 200 mg by mouth as needed. Reported on 03/18/2016    . loratadine (CLARITIN) 10 MG tablet Take 1 tablet by mouth daily as needed. Reported on 03/18/2016    . MULTIPLE VITAMINS-MINERALS PO Take 1 tablet by mouth daily.    . VENTOLIN HFA 108 (90 BASE) MCG/ACT inhaler INHALE 2 PUFFS EVERY 4-6 HOURS AS NEEDED 36 g 2   No current facility-administered medications for this visit.    Allergies:   Penicillins    Social History:  The patient  reports that she has never smoked. She has never used smokeless tobacco. She reports that she drinks alcohol. She reports that she does not use illicit drugs.   Family History:  The patient's family history includes Alcohol abuse in her father; CVA in her mother; Cancer in her mother; Cataracts in her mother; Diabetes in her mother; Heart disease in her sister; Psoriasis in her mother; Stroke in her mother.    ROS:  Please see the history of present illness.   Otherwise, review of systems are positive for none.   All other systems are reviewed and negative.    PHYSICAL EXAM: VS:  BP 134/72 mmHg  Pulse 80  Ht 5\' 5"  (1.651 m)  Wt 160  lb 5 oz (72.717 kg)  BMI 26.68 kg/m2 , BMI Body mass index is 26.68 kg/(m^2). GEN: Well nourished, well developed, in no acute distress HEENT: normal Neck: no JVD, carotid bruits, or masses Cardiac: RRR; no rubs, or gallops,no edema . There is 2/6 systolic ejection murmur in the aortic area Respiratory:  clear to auscultation bilaterally, normal work of breathing GI: soft, nontender, nondistended, + BS MS: no deformity or atrophy Skin: warm and dry, no rash Neuro:  Strength and sensation are intact Psych: euthymic mood, full affect   EKG:  EKG is not ordered today. recent EKG was reviewed which showed sinus rhythm with left anterior  fascicular block   Recent Labs: 02/17/2016: Platelets 299 03/08/2016: ALT 255*; BUN 18; Creatinine, Ser 1.25*; Potassium 4.7; Sodium 142 03/09/2016: TSH 3.620    Lipid Panel    Component Value Date/Time   CHOL 203* 02/17/2016 0959   CHOL 157 07/09/2014   TRIG 72 02/17/2016 0959   HDL 79 02/17/2016 0959   HDL 53 07/09/2014   CHOLHDL 2.6 02/17/2016 0959   LDLCALC 110* 02/17/2016 0959   LDLCALC 82 07/09/2014      Wt Readings from Last 3 Encounters:  03/28/16 160 lb 5 oz (72.717 kg)  03/18/16 162 lb (73.483 kg)  03/08/16 161 lb (73.029 kg)        ASSESSMENT AND PLAN:  1.  Recent stroke of unclear etiology: Likely embolic. I agree with treatment with a statins, low-dose aspirin. Blood pressure is controlled without medications. I did discuss with her the etiology of stroke and the need to look for cardiac source of embolism given that there was no other explanation. Thus, I requested an echocardiogram. I also discussed with her occult atrial fibrillation as a possible culprit in cases of cryptogenic stroke. I suggested proceeding with a 30 day monitor but she once to wait until she doesn't have to travel. She is planning to visit family in [place] in the near future.  2. Essential hypertension: Blood pressure is controlled now without medications and she actually reports significant improvement in symptoms after she was taken off on amlodipine and losartan. Continue to monitor for now.  3. Hyperlipidemia: Currently on atorvastatin.    Disposition:   FU with me as needed.  Signed,  Lorine Bears, MD  03/28/2016 4:54 PM    [facility]

## 2016-03-28 NOTE — Patient Instructions (Signed)
Medication Instructions:  Your physician recommends that you continue on your current medications as directed. Please refer to the Current Medication list given to you today.   Labwork: none  Testing/Procedures: Your physician has requested that you have an echocardiogram. Echocardiography is a painless test that uses sound waves to create images of your heart. It provides your doctor with information about the size and shape of your heart and how well your heart's chambers and valves are working. This procedure takes approximately one hour. There are no restrictions for this procedure.    Follow-Up: Your physician recommends that you schedule a follow-up appointment as needed with Dr. Arida.    Any Other Special Instructions Will Be Listed Below (If Applicable).     If you need a refill on your cardiac medications before your next appointment, please call your pharmacy.  Echocardiogram An echocardiogram, or echocardiography, uses sound waves (ultrasound) to produce an image of your heart. The echocardiogram is simple, painless, obtained within a short period of time, and offers valuable information to your health care provider. The images from an echocardiogram can provide information such as:  Evidence of coronary artery disease (CAD).  Heart size.  Heart muscle function.  Heart valve function.  Aneurysm detection.  Evidence of a past heart attack.  Fluid buildup around the heart.  Heart muscle thickening.  Assess heart valve function. LET YOUR HEALTH CARE PROVIDER KNOW ABOUT:  Any allergies you have.  All medicines you are taking, including vitamins, herbs, eye drops, creams, and over-the-counter medicines.  Previous problems you or members of your family have had with the use of anesthetics.  Any blood disorders you have.  Previous surgeries you have had.  Medical conditions you have.  Possibility of pregnancy, if this applies. BEFORE THE PROCEDURE  No  special preparation is needed. Eat and drink normally.  PROCEDURE   In order to produce an image of your heart, gel will be applied to your chest and a wand-like tool (transducer) will be moved over your chest. The gel will help transmit the sound waves from the transducer. The sound waves will harmlessly bounce off your heart to allow the heart images to be captured in real-time motion. These images will then be recorded.  You may need an IV to receive a medicine that improves the quality of the pictures. AFTER THE PROCEDURE You may return to your normal schedule including diet, activities, and medicines, unless your health care provider tells you otherwise.   This information is not intended to replace advice given to you by your health care provider. Make sure you discuss any questions you have with your health care provider.   Document Released: 10/21/2000 Document Revised: 11/14/2014 Document Reviewed: 07/01/2013 Elsevier Interactive Patient Education 2016 [employer]  

## 2016-04-11 ENCOUNTER — Other Ambulatory Visit: Payer: Self-pay

## 2016-04-11 ENCOUNTER — Ambulatory Visit (INDEPENDENT_AMBULATORY_CARE_PROVIDER_SITE_OTHER): Payer: Medicare Other

## 2016-04-11 DIAGNOSIS — I639 Cerebral infarction, unspecified: Secondary | ICD-10-CM

## 2016-04-11 DIAGNOSIS — I10 Essential (primary) hypertension: Secondary | ICD-10-CM

## 2016-04-11 DIAGNOSIS — R0602 Shortness of breath: Secondary | ICD-10-CM | POA: Diagnosis not present

## 2016-04-11 DIAGNOSIS — R002 Palpitations: Secondary | ICD-10-CM | POA: Diagnosis not present

## 2016-04-12 LAB — ECHOCARDIOGRAM COMPLETE
CHL CUP DOP CALC LVOT VTI: 23.5 cm
CHL CUP PV REG GRAD DIAS: 7 mmHg
CHL CUP STROKE VOLUME: 40 mL
E decel time: 193 msec
EERAT: 10.09
FS: 30 % (ref 28–44)
IVS/LV PW RATIO, ED: 0.98
LA ID, A-P, ES: 29 cm
LA vol A4C: 58.3 ml
LADIAMINDEX: 1.57 cm/m2
LAVOL: 55 cm3
LAVOLIN: 29.9 mL/m2
LDCA: 2.27 cm2
LEFT ATRIUM END SYS DIAM: 29 cm
LV E/e'average: 10.09
LV SIMPSON'S DISK: 61
LV TDI E'LATERAL: 10.6
LV dias vol: 65 mL (ref 46–106)
LV sys vol index: 14 mL/m2
LV sys vol: 25 mL (ref 14–42)
LVDIAVOLIN: 35 mL/m2
LVEEMED: 10.09
LVELAT: 10.6 cm/s
LVOT SV: 53 cm3
LVOT diameter: 17 cm
LVOT peak vel: 102 m/s
MV Dec: 193
MV pk E vel: 107 m/s
MVPG: 5 mmHg
MVPKAVEL: 96 m/s
PV Reg vel dias: 136 cm/s
PW: 8.81 mm — AB (ref 0.6–1.1)
TAPSE: 17.5 cm
TDI e' medial: 7.83

## 2016-05-17 DIAGNOSIS — H25813 Combined forms of age-related cataract, bilateral: Secondary | ICD-10-CM | POA: Diagnosis not present

## 2016-06-13 ENCOUNTER — Encounter: Payer: Self-pay | Admitting: Emergency Medicine

## 2016-06-13 ENCOUNTER — Emergency Department: Payer: Medicare Other

## 2016-06-13 ENCOUNTER — Emergency Department
Admission: EM | Admit: 2016-06-13 | Discharge: 2016-06-13 | Disposition: A | Discharge status: Home / Self Care | Payer: Medicare Other | Attending: Emergency Medicine | Admitting: Emergency Medicine

## 2016-06-13 DIAGNOSIS — Z8673 Personal history of transient ischemic attack (TIA), and cerebral infarction without residual deficits: Secondary | ICD-10-CM | POA: Diagnosis not present

## 2016-06-13 DIAGNOSIS — R531 Weakness: Secondary | ICD-10-CM

## 2016-06-13 DIAGNOSIS — E039 Hypothyroidism, unspecified: Secondary | ICD-10-CM | POA: Insufficient documentation

## 2016-06-13 DIAGNOSIS — I10 Essential (primary) hypertension: Secondary | ICD-10-CM | POA: Diagnosis not present

## 2016-06-13 DIAGNOSIS — D649 Anemia, unspecified: Secondary | ICD-10-CM | POA: Diagnosis not present

## 2016-06-13 DIAGNOSIS — M6281 Muscle weakness (generalized): Secondary | ICD-10-CM | POA: Diagnosis not present

## 2016-06-13 DIAGNOSIS — Z79899 Other long term (current) drug therapy: Secondary | ICD-10-CM | POA: Diagnosis not present

## 2016-06-13 DIAGNOSIS — Z7982 Long term (current) use of aspirin: Secondary | ICD-10-CM | POA: Diagnosis not present

## 2016-06-13 DIAGNOSIS — J45909 Unspecified asthma, uncomplicated: Secondary | ICD-10-CM | POA: Diagnosis not present

## 2016-06-13 LAB — URINALYSIS COMPLETE WITH MICROSCOPIC (ARMC ONLY)
BILIRUBIN URINE: NEGATIVE
Bacteria, UA: NONE SEEN
GLUCOSE, UA: NEGATIVE mg/dL
HGB URINE DIPSTICK: NEGATIVE
Ketones, ur: NEGATIVE mg/dL
LEUKOCYTES UA: NEGATIVE
NITRITE: NEGATIVE
PH: 5 (ref 5.0–8.0)
Protein, ur: NEGATIVE mg/dL
SPECIFIC GRAVITY, URINE: 1.006 (ref 1.005–1.030)

## 2016-06-13 LAB — COMPREHENSIVE METABOLIC PANEL
ALT: 55 U/L — ABNORMAL HIGH (ref 14–54)
AST: 88 U/L — ABNORMAL HIGH (ref 15–41)
Albumin: 3.8 g/dL (ref 3.5–5.0)
Alkaline Phosphatase: 341 U/L — ABNORMAL HIGH (ref 38–126)
Anion gap: 6 (ref 5–15)
BUN: 17 mg/dL (ref 6–20)
CHLORIDE: 108 mmol/L (ref 101–111)
CO2: 26 mmol/L (ref 22–32)
Calcium: 9.2 mg/dL (ref 8.9–10.3)
Creatinine, Ser: 0.82 mg/dL (ref 0.44–1.00)
Glucose, Bld: 125 mg/dL — ABNORMAL HIGH (ref 65–99)
POTASSIUM: 3.8 mmol/L (ref 3.5–5.1)
SODIUM: 140 mmol/L (ref 135–145)
Total Bilirubin: 0.9 mg/dL (ref 0.3–1.2)
Total Protein: 6.1 g/dL — ABNORMAL LOW (ref 6.5–8.1)

## 2016-06-13 LAB — CBC
HCT: 28.6 % — ABNORMAL LOW (ref 35.0–47.0)
Hemoglobin: 8.7 g/dL — ABNORMAL LOW (ref 12.0–16.0)
MCH: 21 pg — AB (ref 26.0–34.0)
MCHC: 30.6 g/dL — ABNORMAL LOW (ref 32.0–36.0)
MCV: 68.6 fL — AB (ref 80.0–100.0)
PLATELETS: 248 10*3/uL (ref 150–440)
RBC: 4.17 MIL/uL (ref 3.80–5.20)
RDW: 18.9 % — AB (ref 11.5–14.5)
WBC: 4 10*3/uL (ref 3.6–11.0)

## 2016-06-13 LAB — TROPONIN I

## 2016-06-13 MED ORDER — DIAZEPAM 5 MG PO TABS
5.0000 mg | ORAL_TABLET | Freq: Once | ORAL | Status: AC
  Administered 2016-06-13: 5 mg via ORAL

## 2016-06-13 MED ORDER — FERROUS SULFATE 325 (65 FE) MG PO TABS: 325.0000 mg | ORAL_TABLET | Freq: Every day | ORAL | 3 refills | Status: DC

## 2016-06-13 MED ORDER — DIAZEPAM 5 MG PO TABS
ORAL_TABLET | ORAL | Status: AC
  Administered 2016-06-13: 5 mg via ORAL
  Filled 2016-06-13: qty 1

## 2016-06-13 MED ORDER — DOCUSATE SODIUM 100 MG PO CAPS: 100.0000 mg | ORAL_CAPSULE | Freq: Two times a day (BID) | ORAL | 2 refills | Status: AC

## 2016-06-13 NOTE — ED Provider Notes (Signed)
[facility] Emergency Department Provider Note  Time seen: 4:22 PM  I have reviewed the triage vital signs and the nursing notes.   HISTORY  Chief Complaint Extremity Weakness    HPI Michele Lawrence is a 70 y.o. female with a past medical history of anxiety, asthma, hypertension, hyperlipidemia, CVA, who presents the emergency department with left arm weakness. According to the patient she had a stroke in February when she experienced left-sided deficits, received TPA and was admitted to the hospital at [facility]. She states she has had no deficits since that time, however last night she once again was feeling left arm tingling and weakness. States she went to bed and the symptoms were gone upon awakening last night. However today she noticed once again occasional tingling and weakness in the left arm and generalized fatigue.Patient was concerned that she could be having a stroke once again so she came to the emergency department for evaluation. Patient denies any headache, she does state mild right chest pain which is been present for several days which she relates to picking up her dog. Denies any worsening of the pain recently. Pain is worse when she pushes on the area. Denies any shortness of breath or nausea. Denies diaphoresis. Denies any confusion or slurred speech.  Past Medical History:  Diagnosis Date  . Asthma   . Hyperlipidemia   . Hypertension   . Stroke [facility])     Patient Active Problem List   Diagnosis Date Noted  . Abnormal LFTs (liver function tests) 03/09/2016  . Fatigue 03/09/2016  . Abnormal weight loss 03/09/2016  . Palpitations 01/19/2016  . Shortness of breath 01/19/2016  . 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 Hyperlipidemia 01/19/2016  . Ischemic stroke (HCC) 12/14/2015  . Need for vaccination 07/17/2015  . Subclinical hypothyroidism 07/17/2015  . Calcium blood increased 07/17/2015  . Belching 07/17/2015  . Airway  hyperreactivity 07/17/2015  . Allergic rhinitis 07/17/2015  . Abnormal blood sugar 02/18/2010  . Herpes zona 06/15/2009  . Family history of breast cancer 12/22/2007  . Benign hypertension 12/21/2007  . HLD (hyperlipidemia) 04/17/2007    No past surgical history on file.  Prior to Admission medications   Medication Sig Start Date End Date Taking? Authorizing Provider  acetaminophen (TYLENOL) 500 MG tablet Take 500 mg by mouth every 6 (six) hours as needed for moderate pain or headache. Reported on 03/18/2016    Historical Provider, MD  aspirin 81 MG tablet Take 81 mg by mouth as needed.  12/21/07   Historical Provider, MD  atorvastatin (LIPITOR) 80 MG tablet Take 1 tablet (80 mg total) by mouth daily at 6 PM. 12/22/15 03/28/16  Lorie Phenix, MD  ibuprofen (ADVIL) 200 MG tablet Take 200 mg by mouth as needed. Reported on 03/18/2016    Historical Provider, MD  loratadine (CLARITIN) 10 MG tablet Take 1 tablet by mouth daily as needed. Reported on 03/18/2016 02/01/12   Historical Provider, MD  MULTIPLE VITAMINS-MINERALS PO Take 1 tablet by mouth daily.    Historical Provider, MD  VENTOLIN HFA 108 (90 BASE) MCG/ACT inhaler INHALE 2 PUFFS EVERY 4-6 HOURS AS NEEDED 09/25/15   Lorie Phenix, MD    Allergies  Allergen Reactions  . Penicillins     Family History  Problem Relation Age of Onset  . Cancer Mother     breast  . Diabetes Mother   . Stroke Mother   . CVA Mother   . Cataracts Mother   . Psoriasis  Mother   . Alcohol abuse Father   . Heart disease Sister     Social History Social History  Substance Use Topics  . Smoking status: Never Smoker  . Smokeless tobacco: Never Used  . Alcohol use Yes     Comment: 1/2 glass of wine qhs    Review of Systems Constitutional: Negative for fever. Cardiovascular: Negative for chest pain. Respiratory: Negative for shortness of breath. Gastrointestinal: Negative for abdominal pain Musculoskeletal: Negative for back pain. Neurological:  Negative for headache. Positive for left arm weakness. 10-point ROS otherwise negative.  ____________________________________________   PHYSICAL EXAM:  VITAL SIGNS: ED Triage Vitals  Enc Vitals Group     BP 06/13/16 1419 127/61     Pulse Rate 06/13/16 1419 67     Resp 06/13/16 1419 17     Temp 06/13/16 1419 98 F (36.7 C)     Temp Source 06/13/16 1419 Oral     SpO2 06/13/16 1419 99 %     Weight 06/13/16 1419 161 lb (73 kg)     Height 06/13/16 1419 5\' 6"  (1.676 m)     Head Circumference --      Peak Flow --      Pain Score 06/13/16 1420 7     Pain Loc --      Pain Edu? --      Excl. in GC? --     Constitutional: Alert and oriented. Well appearing and in no distress. Eyes: Normal exam ENT   Head: Normocephalic and atraumatic.   Mouth/Throat: Mucous membranes are moist. Cardiovascular: Normal rate, regular rhythm. No murmur Respiratory: Normal respiratory effort without tachypnea nor retractions. Breath sounds are clear  Gastrointestinal: Soft and nontender. No distention.  Musculoskeletal: Nontender with normal range of motion in all extremities.  Neurologic:  Normal speech and language. Slight left facial droop however the husband states it appears normal to him. 5/5 motor in all extremities. No pronator drift. No lower extremity drift. Finger to nose testing intact. Skin:  Skin is warm, dry and intact.  Psychiatric: Mood and affect are normal. Speech and behavior are normal.   ____________________________________________    EKG  EKG reviewed and interpreted by myself shows normal sinus rhythm at 60 bpm, narrow QRS, left axis deviation, no concerning ST changes. Overall normal intervals.  ____________________________________________    RADIOLOGY  MRI normal.  ____________________________________________   INITIAL IMPRESSION / ASSESSMENT AND PLAN / ED COURSE  Pertinent labs & imaging results that were available during my care of the patient were  reviewed by me and considered in my medical decision making (see chart for details).  The patient presents the emergency department left arm weakness which started last night, resolved and then was present once again today. Patient also describes a feeling of generalized fatigue. Patient's main concern is ensuring that she has not had another stroke as this is very similar to the symptoms she experienced in February with her CVA but not as severe per patient. We will check labs, urinalysis, MRI and closely monitor in the emergency department.  MRI has resulted as normal. Patient's labs show a decent hemoglobin drop. Rectal exam is negative. Patient does state diminished appetite, possibly leading to iron deficiency anemia. We will start the patient on iron supplements have her follow-up with her primary care doctor this week or next week for repeat lab draw. The patient is agreeable to plan.  ____________________________________________   FINAL CLINICAL IMPRESSION(S) / ED DIAGNOSES  Generalized weakness Anemia  Minna Antis, MD 06/13/16 [id number]

## 2016-06-13 NOTE — ED Notes (Signed)
Patient transported to MRI 

## 2016-06-13 NOTE — ED Triage Notes (Signed)
Pt arrives here accompanied by her spouse with reports of weakness that has been present since awakening today  She also reports tingling in her hand and arm yesterday but reports lying down and that the tingling improved at that time  Moderate grips bilaterally equal

## 2016-06-29 ENCOUNTER — Ambulatory Visit (INDEPENDENT_AMBULATORY_CARE_PROVIDER_SITE_OTHER): Payer: Medicare Other | Admitting: Physician Assistant

## 2016-06-29 ENCOUNTER — Other Ambulatory Visit: Payer: Self-pay | Admitting: Physician Assistant

## 2016-06-29 ENCOUNTER — Encounter: Payer: Self-pay | Admitting: Physician Assistant

## 2016-06-29 VITALS — BP 104/64 | HR 74 | Temp 98.4°F | Resp 16 | Wt 145.0 lb

## 2016-06-29 DIAGNOSIS — R945 Abnormal results of liver function studies: Secondary | ICD-10-CM

## 2016-06-29 DIAGNOSIS — R634 Abnormal weight loss: Secondary | ICD-10-CM | POA: Diagnosis not present

## 2016-06-29 DIAGNOSIS — R7989 Other specified abnormal findings of blood chemistry: Secondary | ICD-10-CM

## 2016-06-29 DIAGNOSIS — T63301A Toxic effect of unspecified spider venom, accidental (unintentional), initial encounter: Secondary | ICD-10-CM | POA: Diagnosis not present

## 2016-06-29 DIAGNOSIS — D509 Iron deficiency anemia, unspecified: Secondary | ICD-10-CM

## 2016-06-29 DIAGNOSIS — R5383 Other fatigue: Secondary | ICD-10-CM | POA: Diagnosis not present

## 2016-06-29 DIAGNOSIS — R531 Weakness: Secondary | ICD-10-CM

## 2016-06-29 DIAGNOSIS — R7889 Finding of other specified substances, not normally found in blood: Secondary | ICD-10-CM | POA: Diagnosis not present

## 2016-06-29 DIAGNOSIS — I639 Cerebral infarction, unspecified: Secondary | ICD-10-CM

## 2016-06-29 MED ORDER — CEPHALEXIN 500 MG PO CAPS: 500.0000 mg | ORAL_CAPSULE | Freq: Two times a day (BID) | ORAL | 0 refills | Status: DC

## 2016-06-29 NOTE — Patient Instructions (Signed)
Iron-Rich Diet Iron is a mineral that helps your body to produce hemoglobin. Hemoglobin is a protein in your red blood cells that carries oxygen to your body's tissues. Eating too little iron may cause you to feel weak and tired, and it can increase your risk for infection. Eating enough iron is necessary for your body's metabolism, muscle function, and nervous system. Iron is naturally found in many foods. It can also be added to foods or fortified in foods. There are two types of dietary iron:  Heme iron. Heme iron is absorbed by the body more easily than nonheme iron. Heme iron is found in meat, poultry, and fish.  Nonheme iron. Nonheme iron is found in dietary supplements, iron-fortified grains, beans, and vegetables. You may need to follow an iron-rich diet if:  You have been diagnosed with iron deficiency or iron-deficiency anemia.  You have a condition that prevents you from absorbing dietary iron, such as:  Infection in your intestines.  Celiac disease. This involves long-lasting (chronic) inflammation of your intestines.  You do not eat enough iron.  You eat a diet that is high in foods that impair iron absorption.  You have lost a lot of blood.  You have heavy bleeding during your menstrual cycle.  You are pregnant. WHAT IS MY PLAN? Your health care provider may help you to determine how much iron you need per day based on your condition. Generally, when a person consumes sufficient amounts of iron in the diet, the following iron needs are met:  Men.  56-2 years old: 11 mg per day.  12-73 years old: 8 mg per day.  Women.   56-4 years old: 15 mg per day.  35-31 years old: 18 mg per day.  Over 82 years old: 8 mg per day.  Pregnant women: 27 mg per day.  Breastfeeding women: 9 mg per day. WHAT DO I NEED TO KNOW ABOUT AN IRON-RICH DIET?  Eat fresh fruits and vegetables that are high in vitamin C along with foods that are high in iron. This will help increase  the amount of iron that your body absorbs from food, especially with foods containing nonheme iron. Foods that are high in vitamin C include oranges, peppers, tomatoes, and mango.  Take iron supplements only as directed by your health care provider. Overdose of iron can be life-threatening. If you were prescribed iron supplements, take them with orange juice or a vitamin C supplement.  Cook foods in pots and pans that are made from iron.   Eat nonheme iron-containing foods alongside foods that are high in heme iron. This helps to improve your iron absorption.   Certain foods and drinks contain compounds that impair iron absorption. Avoid eating these foods in the same meal as iron-rich foods or with iron supplements. These include:  Coffee, black tea, and red wine.  Milk, dairy products, and foods that are high in calcium.  Beans, soybeans, and peas.  Whole grains.  When eating foods that contain both nonheme iron and compounds that impair iron absorption, follow these tips to absorb iron better.   Soak beans overnight before cooking.  Soak whole grains overnight and drain them before using.  Ferment flours before baking, such as using yeast in bread dough. WHAT FOODS CAN I EAT? Grains Iron-fortified breakfast cereal. Iron-fortified whole-wheat bread. Enriched rice. Sprouted grains. Vegetables Spinach. Potatoes with skin. Green peas. Broccoli. Red and green bell peppers. Fermented vegetables. Fruits Prunes. Raisins. Oranges. Strawberries. Mango. Grapefruit. Meats and Other Protein  Sources Beef liver. Oysters. Beef. Shrimp. [place]. Chicken. [place]. Sardines. Chickpeas. Nuts. Tofu. Beverages Tomato juice. Fresh orange juice. Prune juice. Hibiscus tea. Fortified instant breakfast shakes. Condiments Tahini. Fermented soy sauce. Sweets and Desserts Black-strap molasses.  Other Wheat germ. The items listed above may not be a complete list of recommended foods or  beverages. Contact your dietitian for more options. WHAT FOODS ARE NOT RECOMMENDED? Grains Whole grains. Bran cereal. Bran flour. Oats. Vegetables Artichokes. Brussels sprouts. Kale. Fruits Blueberries. Raspberries. Strawberries. Figs. Meats and Other Protein Sources Soybeans. Products made from soy protein. Dairy Milk. Cream. Cheese. Yogurt. Cottage cheese. Beverages Coffee. Black tea. Red wine. Sweets and Desserts Cocoa. Chocolate. Ice cream. Other Basil. Oregano. Parsley. The items listed above may not be a complete list of foods and beverages to avoid. Contact your dietitian for more information.   This information is not intended to replace advice given to you by your health care provider. Make sure you discuss any questions you have with your health care provider.   Document Released: 06/07/2005 Document Revised: 11/14/2014 Document Reviewed: 05/21/2014 Elsevier Interactive Patient Education 2016 [employer]. Iron Deficiency Anemia, Adult Anemia is a condition in which there are less red blood cells or hemoglobin in the blood than normal. Hemoglobin is the part of red blood cells that carries oxygen. Iron deficiency anemia is anemia caused by too little iron. It is the most common type of anemia. It may leave you tired and short of breath. CAUSES   Lack of iron in the diet.  Poor absorption of iron, as seen with intestinal disorders.  Intestinal bleeding.  Heavy periods. SIGNS AND SYMPTOMS  Mild anemia may not be noticeable. Symptoms may include:  Fatigue.  Headache.  Pale skin.  Weakness.  Tiredness.  Shortness of breath.  Dizziness.  Cold hands and feet.  Fast or irregular heartbeat. DIAGNOSIS  Diagnosis requires a thorough evaluation and physical exam by your health care provider. Blood tests are generally used to confirm iron deficiency anemia. Additional tests may be done to find the underlying cause of your anemia. These may  include:  Testing for blood in the stool (fecal occult blood test).  A procedure to see inside the colon and rectum (colonoscopy).  A procedure to see inside the esophagus and stomach (endoscopy). TREATMENT  Iron deficiency anemia is treated by correcting the cause of the deficiency. Treatment may involve:  Adding iron-rich foods to your diet.  Taking iron supplements. Pregnant or breastfeeding women need to take extra iron because their normal diet usually does not provide the required amount.  Taking vitamins. Vitamin C improves the absorption of iron. Your health care provider may recommend that you take your iron tablets with a glass of orange juice or vitamin C supplement.  Medicines to make heavy menstrual flow lighter.  Surgery. HOME CARE INSTRUCTIONS   Take iron as directed by your health care provider.  If you cannot tolerate taking iron supplements by mouth, talk to your health care provider about taking them through a vein (intravenously) or an injection into a muscle.  For the best iron absorption, iron supplements should be taken on an empty stomach. If you cannot tolerate them on an empty stomach, you may need to take them with food.  Do not drink milk or take antacids at the same time as your iron supplements. Milk and antacids may interfere with the absorption of iron.  Iron supplements can cause constipation. Make sure to include fiber in your diet to prevent  constipation. A stool softener may also be recommended.  Take vitamins as directed by your health care provider.  Eat a diet rich in iron. Foods high in iron include liver, lean beef, whole-grain bread, eggs, dried fruit, and dark green leafy vegetables. SEEK IMMEDIATE MEDICAL CARE IF:   You faint. If this happens, do not drive. Call your local emergency services (911 in [place] if no other help is available.  You have chest pain.  You feel nauseous or vomit.  You have severe or increased shortness of  breath with activity.  You feel weak.  You have a rapid heartbeat.  You have unexplained sweating.  You become light-headed when getting up from a chair or bed. MAKE SURE YOU:   Understand these instructions.  Will watch your condition.  Will get help right away if you are not doing well or get worse.   This information is not intended to replace advice given to you by your health care provider. Make sure you discuss any questions you have with your health care provider.   Document Released: 10/21/2000 Document Revised: 11/14/2014 Document Reviewed: 07/01/2013 Elsevier Interactive Patient Education [employer].

## 2016-06-29 NOTE — Progress Notes (Signed)
Patient: Michele LinesMaureen A Forge Female    DOB: 1945-11-27   70 y.o.   MRN: [id number] Visit Date: 06/29/2016  Today's Provider: Margaretann LovelessJennifer M Jaquila Santelli, PA-C   Chief Complaint  Patient presents with  . [facility]  . Rash   Subjective:    HPI  Follow Up ER Visit  Patient is here for ER follow up.  She was recently seen at [facility] for Generalized weakness and anemia on 06/13/16. Treatment for this included Iron supplement. She reports excellent compliance with treatment. She reports this condition is Improved. She report feels much better from the weakness and the fatigue, but not totally well. She also reports that her appetite has changed. She reports her weight at the hospital was 160 lbs and today in office is 145 lbs. Unknown cause of weight loss at this time. States she does not have loss of appetite. She had her labs done today.  ------------------------------------------------------------------------------------ Rash:Patient also reports that [place] past weekend and 3 days ago she notice the rash, right leg. She reports it was itching and now is only red. She was on her son's boat and noticed a "bump" that she picked off then redness and swelling developed. No fevers, chills, nausea or vomiting.    Allergies  Allergen Reactions  . Penicillins    Current Meds  Medication Sig  . aspirin 81 MG tablet Take 81 mg by mouth as needed.   . docusate sodium (COLACE) 100 MG capsule Take 1 capsule (100 mg total) by mouth 2 (two) times daily.  . ferrous sulfate 325 (65 FE) MG tablet Take 1 tablet (325 mg total) by mouth daily with breakfast.  . ibuprofen (ADVIL) 200 MG tablet Take 200 mg by mouth as needed. Reported on 03/18/2016  . loratadine (CLARITIN) 10 MG tablet Take 1 tablet by mouth daily as needed. Reported on 03/18/2016  . MULTIPLE VITAMINS-MINERALS PO Take 1 tablet by mouth daily.  . VENTOLIN HFA 108 (90 BASE) MCG/ACT inhaler INHALE 2 PUFFS EVERY 4-6  HOURS AS NEEDED    Review of Systems  Constitutional: Positive for fatigue and unexpected weight change. Negative for appetite change, chills, diaphoresis and fever.  HENT: Negative.   Respiratory: Negative for cough, chest tightness and shortness of breath.   Cardiovascular: Negative for chest pain, palpitations and leg swelling.  Gastrointestinal: Negative for abdominal pain, constipation, diarrhea and nausea.  Skin: Positive for rash.  Neurological: Positive for weakness. Negative for dizziness.    Social History  Substance Use Topics  . Smoking status: Never Smoker  . Smokeless tobacco: Never Used  . Alcohol use Yes     Comment: 1/2 glass of wine qhs   Objective:   BP 104/64 (BP Location: Left Arm, Patient Position: Sitting, Cuff Size: Normal)   Pulse 74   Temp 98.4 F (36.9 C) (Oral)   Resp 16   Wt 145 lb (65.8 kg)   SpO2 99%   BMI 23.40 kg/m   Physical Exam  Constitutional: She appears well-developed and well-nourished. No distress.  Neck: Normal range of motion. Neck supple. No JVD present. No tracheal deviation present. No thyromegaly present.  Cardiovascular: Normal rate, regular rhythm and normal heart sounds.  Exam reveals no gallop and no friction rub.   No murmur heard. Pulmonary/Chest: Effort normal and breath sounds normal. No respiratory distress. She has no wheezes. She has no rales.  Abdominal: Soft. Bowel sounds are normal. She exhibits no  distension and no mass. There is no tenderness. There is no rebound and no guarding.  Musculoskeletal: She exhibits no edema.  Lymphadenopathy:    She has no cervical adenopathy.  Skin: She is not diaphoretic.  Vitals reviewed.     Assessment & Plan:     1. Iron deficiency anemia Labs were drawn this morning and will be awaiting results. Will increase iron to 325mg  BID. Will recheck labs in 4 weeks. Recommend GI referral for work up of possible GI blood loss and weight loss.  - ferrous sulfate 325 (65 FE) MG  tablet; Take 1 tablet (325 mg total) by mouth 2 (two) times daily with a meal.  Dispense: 60 tablet; Refill: 3  2. Spider bite, accidental or unintentional, initial encounter Suspect spider bite of right anterior thigh. Will give keflex as below. She is to call if no improvement. - cephALEXin (KEFLEX) 500 MG capsule; Take 1 capsule (500 mg total) by mouth 2 (two) times daily.  Dispense: 10 capsule; Refill: 0  3. Other fatigue See above medical treatment plan for #1.  4. Weakness See above medical treatment plan for #1.  5. Weight loss See above medical treatment plan for #1.       Margaretann LovelessJennifer M Mckell Riecke, PA-C  [facility]

## 2016-06-30 ENCOUNTER — Telehealth: Payer: Self-pay

## 2016-06-30 LAB — IRON AND TIBC
Iron Saturation: 5 % — CL (ref 15–55)
Iron: 23 ug/dL — ABNORMAL LOW (ref 27–139)
Total Iron Binding Capacity: 432 ug/dL (ref 250–450)
UIBC: 409 ug/dL — ABNORMAL HIGH (ref 118–369)

## 2016-06-30 LAB — COMPREHENSIVE METABOLIC PANEL
ALK PHOS: 331 IU/L — AB (ref 39–117)
ALT: 44 IU/L — AB (ref 0–32)
AST: 70 IU/L — AB (ref 0–40)
Albumin/Globulin Ratio: 2.4 — ABNORMAL HIGH (ref 1.2–2.2)
Albumin: 4.3 g/dL (ref 3.5–4.8)
BUN / CREAT RATIO: 17 (ref 12–28)
BUN: 15 mg/dL (ref 8–27)
Bilirubin Total: 0.8 mg/dL (ref 0.0–1.2)
CALCIUM: 9.3 mg/dL (ref 8.7–10.3)
CO2: 23 mmol/L (ref 18–29)
CREATININE: 0.9 mg/dL (ref 0.57–1.00)
Chloride: 105 mmol/L (ref 96–106)
GFR, EST AFRICAN AMERICAN: 74 mL/min/{1.73_m2} (ref >59)
GFR, EST NON AFRICAN AMERICAN: 65 mL/min/{1.73_m2} (ref >59)
GLOBULIN, TOTAL: 1.8 g/dL (ref 1.5–4.5)
GLUCOSE: 94 mg/dL (ref 65–99)
POTASSIUM: 4.1 mmol/L (ref 3.5–5.2)
SODIUM: 143 mmol/L (ref 134–144)
Total Protein: 6.1 g/dL (ref 6.0–8.5)

## 2016-06-30 LAB — CBC WITH DIFFERENTIAL/PLATELET
BASOS: 1 %
Basophils Absolute: 0 10*3/uL (ref 0.0–0.2)
EOS (ABSOLUTE): 0.1 10*3/uL (ref 0.0–0.4)
Eos: 2 %
HEMATOCRIT: 34.1 % (ref 34.0–46.6)
Hemoglobin: 9.6 g/dL — ABNORMAL LOW (ref 11.1–15.9)
IMMATURE GRANS (ABS): 0 10*3/uL (ref 0.0–0.1)
Immature Granulocytes: 0 %
LYMPHS: 27 %
Lymphocytes Absolute: 0.9 10*3/uL (ref 0.7–3.1)
MCH: 21.6 pg — ABNORMAL LOW (ref 26.6–33.0)
MCHC: 28.2 g/dL — AB (ref 31.5–35.7)
MCV: 77 fL — ABNORMAL LOW (ref 79–97)
MONOS ABS: 0.4 10*3/uL (ref 0.1–0.9)
Monocytes: 10 %
NEUTROS PCT: 60 %
Neutrophils Absolute: 2 10*3/uL (ref 1.4–7.0)
PLATELETS: 273 10*3/uL (ref 150–379)
RBC: 4.45 x10E6/uL (ref 3.77–5.28)
RDW: 22.3 % — AB (ref 12.3–15.4)
WBC: 3.4 10*3/uL (ref 3.4–10.8)

## 2016-06-30 MED ORDER — FERROUS SULFATE 325 (65 FE) MG PO TABS: 325.0000 mg | ORAL_TABLET | Freq: Two times a day (BID) | ORAL | 3 refills | Status: DC

## 2016-06-30 NOTE — Telephone Encounter (Signed)
Patient advised. Patient states she would prefer to try the increased dose of iron before proceeding with GI referral.

## 2016-06-30 NOTE — Telephone Encounter (Signed)
Prescription for Ferrous Sulfate 325 MG [facility] [place].  Thanks,  -Siddalee Vanderheiden

## 2016-06-30 NOTE — Telephone Encounter (Signed)
-----   Message from Margaretann LovelessJennifer M Burnette, [place]PA-C sent at 06/30/2016 12:38 PM EDT ----- HgB is improving and is up to 9.6 from 8.7. Iron still low. Recommend increasing to ferrous sulfate 325mg  BID. Can recheck labs in 4 weeks again to make sure still improving also. Consider GI referral if wanted by patient.

## 2016-07-01 ENCOUNTER — Telehealth: Payer: Self-pay | Admitting: Physician Assistant

## 2016-07-01 DIAGNOSIS — D509 Iron deficiency anemia, unspecified: Secondary | ICD-10-CM

## 2016-07-01 NOTE — Telephone Encounter (Signed)
Please review-aa 

## 2016-07-01 NOTE — Telephone Encounter (Signed)
Pt's husband called and scheduled a 4 week f/u for 07/26/16 and would like to pick up a lab slip prior to the appt to have labs done that morning before the appt. Please advise. Thanks TNP

## 2016-07-12 NOTE — Telephone Encounter (Signed)
Patient advised.

## 2016-07-12 NOTE — Telephone Encounter (Signed)
Lab slip printed and can be picked up the day she comes for lab draw.

## 2016-07-26 ENCOUNTER — Ambulatory Visit (INDEPENDENT_AMBULATORY_CARE_PROVIDER_SITE_OTHER): Payer: Medicare Other | Admitting: Physician Assistant

## 2016-07-26 ENCOUNTER — Encounter: Payer: Self-pay | Admitting: Physician Assistant

## 2016-07-26 VITALS — BP 100/60 | HR 54 | Temp 97.6°F | Resp 16 | Wt 144.4 lb

## 2016-07-26 DIAGNOSIS — D509 Iron deficiency anemia, unspecified: Secondary | ICD-10-CM | POA: Diagnosis not present

## 2016-07-26 DIAGNOSIS — I639 Cerebral infarction, unspecified: Secondary | ICD-10-CM

## 2016-07-26 NOTE — Progress Notes (Signed)
[name] Female    DOB: 1946-05-31   70 y.o.   MRN: [id number] Visit Date: 07/26/2016  Today's Provider: Margaretann Loveless, PA-C   Chief Complaint  Patient presents with  . Follow-up    Labs   Subjective:    HPI Anemia: Patient presents for presents evaluation of anemia. Anemia was found by ER visit on 06/13/2016. Patient labs were repeated on 8/23. Her Iron Medication was increased to 325 MG BID.Labs were drawn this morning.Patient  Associated signs & symptoms: dizziness/lightheadedness.(Lightheadeness today, thinks because she is fasting since she had her labs done this morning). She reports that she has more energy and feels about 80% better.  She also reports that she is keeping track of her blood pressure's. The readings are in between the upper 120's-100's and 80's-70's.      Allergies  Allergen Reactions  . Penicillins      Current Outpatient Prescriptions:  .  aspirin 81 MG tablet, Take 81 mg by mouth as needed. , Disp: , Rfl:  .  atorvastatin (LIPITOR) 80 MG tablet, Take 80 mg by mouth., Disp: , Rfl:  .  docusate sodium (COLACE) 100 MG capsule, Take 1 capsule (100 mg total) by mouth 2 (two) times daily., Disp: 60 capsule, Rfl: 2 .  ferrous sulfate 325 (65 FE) MG tablet, Take 1 tablet (325 mg total) by mouth 2 (two) times daily with a meal., Disp: 60 tablet, Rfl: 3 .  ibuprofen (ADVIL) 200 MG tablet, Take 200 mg by mouth as needed. Reported on 03/18/2016, Disp: , Rfl:  .  loratadine (CLARITIN) 10 MG tablet, Take 1 tablet by mouth daily as needed. Reported on 03/18/2016, Disp: , Rfl:  .  MULTIPLE VITAMINS-MINERALS PO, Take 1 tablet by mouth daily., Disp: , Rfl:  .  VENTOLIN HFA 108 (90 BASE) MCG/ACT inhaler, INHALE 2 PUFFS EVERY 4-6 HOURS AS NEEDED, Disp: 36 g, Rfl: 2 .  acetaminophen (TYLENOL) 500 MG tablet, Take 500 mg by mouth every 6 (six) hours as needed for moderate pain or headache. Reported on 03/18/2016, Disp: , Rfl:  .  atorvastatin  (LIPITOR) 80 MG tablet, Take 1 tablet (80 mg total) by mouth daily at 6 PM., Disp: 90 tablet, Rfl: 3 .  cephALEXin (KEFLEX) 500 MG capsule, Take 1 capsule (500 mg total) by mouth 2 (two) times daily. (Patient not taking: Reported on 07/26/2016), Disp: 10 capsule, Rfl: 0  Review of Systems  Constitutional: Negative.   Respiratory: Negative for cough, chest tightness and shortness of breath.   Cardiovascular: Negative for chest pain, palpitations and leg swelling.  Gastrointestinal: Negative for abdominal pain, anal bleeding, blood in stool, constipation and diarrhea.  Neurological: Negative for dizziness, light-headedness and numbness.  Psychiatric/Behavioral: Negative.     Social History  Substance Use Topics  . Smoking status: Never Smoker  . Smokeless tobacco: Never Used  . Alcohol use Yes     Comment: 1/2 glass of wine qhs   Objective:   BP 100/60 (BP Location: Left Arm, Patient Position: Sitting, Cuff Size: Normal)   Pulse (!) 54   Temp 97.6 F (36.4 C) (Oral)   Resp 16   Wt 144 lb 6.4 oz (65.5 kg)   BMI 23.31 kg/m   Physical Exam  Constitutional: She appears well-developed and well-nourished. No distress.  Neck: Normal range of motion. Neck supple. No tracheal deviation present. No thyromegaly present.  Cardiovascular: Normal rate, regular rhythm and normal heart sounds.  Exam reveals  no gallop and no friction rub.   No murmur heard. Pulmonary/Chest: Effort normal and breath sounds normal. No respiratory distress. She has no wheezes. She has no rales.  Lymphadenopathy:    She has no cervical adenopathy.  Skin: She is not diaphoretic.  Psychiatric: She has a normal mood and affect. Her behavior is normal. Judgment and thought content normal.  Vitals reviewed.     Assessment & Plan:     1. Iron deficiency anemia Improving. She did have labs drawn this morning so we will await those results. I will follow-up with her pending these results once I receive them. She is to  continue taking her iron supplement. I will see her back in October for her mood Medicare annual wellness.       Margaretann LovelessJennifer M Burnette, PA-C  [facility]

## 2016-07-26 NOTE — Patient Instructions (Signed)
Iron-Rich Diet Iron is a mineral that helps your body to produce hemoglobin. Hemoglobin is a protein in your red blood cells that carries oxygen to your body's tissues. Eating too little iron may cause you to feel weak and tired, and it can increase your risk for infection. Eating enough iron is necessary for your body's metabolism, muscle function, and nervous system. Iron is naturally found in many foods. It can also be added to foods or fortified in foods. There are two types of dietary iron:  Heme iron. Heme iron is absorbed by the body more easily than nonheme iron. Heme iron is found in meat, poultry, and fish.  Nonheme iron. Nonheme iron is found in dietary supplements, iron-fortified grains, beans, and vegetables. You may need to follow an iron-rich diet if:  You have been diagnosed with iron deficiency or iron-deficiency anemia.  You have a condition that prevents you from absorbing dietary iron, such as:  Infection in your intestines.  Celiac disease. This involves long-lasting (chronic) inflammation of your intestines.  You do not eat enough iron.  You eat a diet that is high in foods that impair iron absorption.  You have lost a lot of blood.  You have heavy bleeding during your menstrual cycle.  You are pregnant. WHAT IS MY PLAN? Your health care provider may help you to determine how much iron you need per day based on your condition. Generally, when a person consumes sufficient amounts of iron in the diet, the following iron needs are met:  Men.  35-85 years old: 11 mg per day.  43-3 years old: 8 mg per day.  Women.   55-40 years old: 15 mg per day.  61-47 years old: 18 mg per day.  Over 53 years old: 8 mg per day.  Pregnant women: 27 mg per day.  Breastfeeding women: 9 mg per day. WHAT DO I NEED TO KNOW ABOUT AN IRON-RICH DIET?  Eat fresh fruits and vegetables that are high in vitamin C along with foods that are high in iron. This will help increase  the amount of iron that your body absorbs from food, especially with foods containing nonheme iron. Foods that are high in vitamin C include oranges, peppers, tomatoes, and mango.  Take iron supplements only as directed by your health care provider. Overdose of iron can be life-threatening. If you were prescribed iron supplements, take them with orange juice or a vitamin C supplement.  Cook foods in pots and pans that are made from iron.   Eat nonheme iron-containing foods alongside foods that are high in heme iron. This helps to improve your iron absorption.   Certain foods and drinks contain compounds that impair iron absorption. Avoid eating these foods in the same meal as iron-rich foods or with iron supplements. These include:  Coffee, black tea, and red wine.  Milk, dairy products, and foods that are high in calcium.  Beans, soybeans, and peas.  Whole grains.  When eating foods that contain both nonheme iron and compounds that impair iron absorption, follow these tips to absorb iron better.   Soak beans overnight before cooking.  Soak whole grains overnight and drain them before using.  Ferment flours before baking, such as using yeast in bread dough. WHAT FOODS CAN I EAT? Grains Iron-fortified breakfast cereal. Iron-fortified whole-wheat bread. Enriched rice. Sprouted grains. Vegetables Spinach. Potatoes with skin. Green peas. Broccoli. Red and green bell peppers. Fermented vegetables. Fruits Prunes. Raisins. Oranges. Strawberries. Mango. Grapefruit. Meats and Other Protein  Sources Beef liver. Oysters. Beef. Shrimp. [place]. Chicken. [place]. Sardines. Chickpeas. Nuts. Tofu. Beverages Tomato juice. Fresh orange juice. Prune juice. Hibiscus tea. Fortified instant breakfast shakes. Condiments Tahini. Fermented soy sauce. Sweets and Desserts Black-strap molasses.  Other Wheat germ. The items listed above may not be a complete list of recommended foods or  beverages. Contact your dietitian for more options. WHAT FOODS ARE NOT RECOMMENDED? Grains Whole grains. Bran cereal. Bran flour. Oats. Vegetables Artichokes. Brussels sprouts. Kale. Fruits Blueberries. Raspberries. Strawberries. Figs. Meats and Other Protein Sources Soybeans. Products made from soy protein. Dairy Milk. Cream. Cheese. Yogurt. Cottage cheese. Beverages Coffee. Black tea. Red wine. Sweets and Desserts Cocoa. Chocolate. Ice cream. Other Basil. Oregano. Parsley. The items listed above may not be a complete list of foods and beverages to avoid. Contact your dietitian for more information.   This information is not intended to replace advice given to you by your health care provider. Make sure you discuss any questions you have with your health care provider.   Document Released: 06/07/2005 Document Revised: 11/14/2014 Document Reviewed: 05/21/2014 Elsevier Interactive Patient Education [employer].

## 2016-07-27 ENCOUNTER — Telehealth: Payer: Self-pay

## 2016-07-27 LAB — CBC
HEMOGLOBIN: 10.7 g/dL — AB (ref 11.1–15.9)
Hematocrit: 36.5 % (ref 34.0–46.6)
MCH: 23.3 pg — ABNORMAL LOW (ref 26.6–33.0)
MCHC: 29.3 g/dL — ABNORMAL LOW (ref 31.5–35.7)
MCV: 80 fL (ref 79–97)
Platelets: 178 10*3/uL (ref 150–379)
RBC: 4.59 x10E6/uL (ref 3.77–5.28)
RDW: 23 % — ABNORMAL HIGH (ref 12.3–15.4)
WBC: 2.9 10*3/uL — ABNORMAL LOW (ref 3.4–10.8)

## 2016-07-27 LAB — IRON AND TIBC
IRON SATURATION: 58 % — AB (ref 15–55)
Iron: 230 ug/dL — ABNORMAL HIGH (ref 27–139)
TIBC: 398 ug/dL (ref 250–450)
UIBC: 168 ug/dL (ref 118–369)

## 2016-07-27 NOTE — Telephone Encounter (Signed)
-----   Message from Margaretann LovelessJennifer M Burnette, [place]PA-C sent at 07/27/2016  8:43 AM EDT ----- HgB continues to improve and is up to 10.7! Iron levels are now normal. I believe if you wish you may start taking ferrous sulfate only one time per day. We can recheck in 8-12 weeks and see if it remains stable. You will not require a visit for that, just a lab draw since I am seeing you again in Oct for your AWV.

## 2016-07-27 NOTE — Telephone Encounter (Signed)
Patient advised as below. Patient verbalizes understanding and is in agreement with treatment plan.  

## 2016-08-18 ENCOUNTER — Ambulatory Visit: Payer: Medicare Other | Admitting: Physician Assistant

## 2016-08-25 ENCOUNTER — Encounter: Payer: Self-pay | Admitting: Physician Assistant

## 2016-08-25 ENCOUNTER — Ambulatory Visit (INDEPENDENT_AMBULATORY_CARE_PROVIDER_SITE_OTHER): Payer: Medicare Other | Admitting: Physician Assistant

## 2016-08-25 VITALS — BP 110/78 | HR 80 | Temp 98.1°F | Resp 16 | Ht 64.0 in | Wt 144.0 lb

## 2016-08-25 DIAGNOSIS — E7800 Pure hypercholesterolemia, unspecified: Secondary | ICD-10-CM

## 2016-08-25 DIAGNOSIS — I10 Essential (primary) hypertension: Secondary | ICD-10-CM | POA: Diagnosis not present

## 2016-08-25 DIAGNOSIS — E039 Hypothyroidism, unspecified: Secondary | ICD-10-CM | POA: Diagnosis not present

## 2016-08-25 DIAGNOSIS — R7309 Other abnormal glucose: Secondary | ICD-10-CM

## 2016-08-25 DIAGNOSIS — R7989 Other specified abnormal findings of blood chemistry: Secondary | ICD-10-CM | POA: Diagnosis not present

## 2016-08-25 DIAGNOSIS — D509 Iron deficiency anemia, unspecified: Secondary | ICD-10-CM | POA: Diagnosis not present

## 2016-08-25 DIAGNOSIS — E038 Other specified hypothyroidism: Secondary | ICD-10-CM

## 2016-08-25 DIAGNOSIS — R945 Abnormal results of liver function studies: Secondary | ICD-10-CM

## 2016-08-25 DIAGNOSIS — Z0000 Encounter for general adult medical examination without abnormal findings: Secondary | ICD-10-CM | POA: Diagnosis not present

## 2016-08-25 NOTE — Patient Instructions (Signed)

## 2016-08-25 NOTE — Progress Notes (Signed)
[name], Female    DOB: December 10, 1945, 70 y.o.   MRN: [id number] Visit Date: 08/25/2016  Today's Provider: Margaretann Loveless, PA-C   Chief Complaint  Patient presents with  . Medicare Wellness   Subjective:    Annual wellness visit Michele Lawrence is a 70 y.o. female. She feels well. She reports exercising daily. She reports she is sleeping well. She did have a CVA in February 2017. She has been doing well since. She also was found to have significant anemia secondary to iron deficiency. She was started on oral iron supplement and iron levels were improving.   Colonoscopy:2005-Normal; patient wants to wait until she returns to [facility] PPSV13:02/18/2010; unknown if she received Pneumococcal 23 Mammogram: Unknown; patient prefers to wait until she gets back in town (possibly going to [place] for the Winter).  Zoster: 08/24/10 BMD: Unknown; none on file -----------------------------------------------------------   Review of Systems  Constitutional: Negative.   HENT: Negative.   Eyes: Negative.   Respiratory: Negative.   Cardiovascular: Negative.   Gastrointestinal: Negative.   Endocrine: Negative.   Genitourinary: Negative.   Musculoskeletal: Negative.   Skin: Negative.   Allergic/Immunologic: Negative.   Neurological: Negative.   Hematological: Negative.   Psychiatric/Behavioral: Negative.     Social History   Social History  . Marital status: Married    Spouse name: N/A  . Number of children: N/A  . Years of education: N/A   Occupational History  . Not on file.   Social History Main Topics  . Smoking status: Never Smoker  . Smokeless tobacco: Never Used  . Alcohol use Yes     Comment: 1/2 glass of wine qhs  . Drug use: No  . Sexual activity: Not on file   Other Topics Concern  . Not on file   Social History Narrative  . No narrative on file    Past Medical History:  Diagnosis Date  . Asthma   . Hyperlipidemia   .  Hypertension   . Stroke [facility])      Patient Active Problem List   Diagnosis Date Noted  . Abnormal LFTs (liver function tests) 03/09/2016  . Fatigue 03/09/2016  . Abnormal weight loss 03/09/2016  . Palpitations 01/19/2016  . Shortness of breath 01/19/2016  . 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 Hyperlipidemia 01/19/2016  . Ischemic stroke (HCC) 12/14/2015  . Need for vaccination 07/17/2015  . Subclinical hypothyroidism 07/17/2015  . Calcium blood increased 07/17/2015  . Belching 07/17/2015  . Airway hyperreactivity 07/17/2015  . Allergic rhinitis 07/17/2015  . Abnormal blood sugar 02/18/2010  . Herpes zona 06/15/2009  . Family history of breast cancer 12/22/2007  . Benign hypertension 12/21/2007  . HLD (hyperlipidemia) 04/17/2007    Past Surgical History:  Procedure Laterality Date  . APPENDECTOMY    . TONSILLECTOMY      Her family history includes Alcohol abuse in her father; CVA in her mother; Cancer in her mother; Cataracts in her mother; Diabetes in her mother; Heart disease in her sister; Psoriasis in her mother; Stroke in her mother.    Current Meds  Medication Sig  . acetaminophen (TYLENOL) 500 MG tablet Take 500 mg by mouth every 6 (six) hours as needed for moderate pain or headache. Reported on 03/18/2016  . aspirin 81 MG tablet Take 81 mg by mouth daily.   Marland Kitchen atorvastatin (LIPITOR) 80 MG tablet Take 1 tablet (80 mg total) by mouth daily at 6 PM.  .  docusate sodium (COLACE) 100 MG capsule Take 1 capsule (100 mg total) by mouth 2 (two) times daily.  . ferrous sulfate 325 (65 FE) MG tablet Take 1 tablet (325 mg total) by mouth 2 (two) times daily with a meal.  . ibuprofen (ADVIL) 200 MG tablet Take 200 mg by mouth as needed. Reported on 03/18/2016  . loratadine (CLARITIN) 10 MG tablet Take 1 tablet by mouth daily as needed. Reported on 03/18/2016  . MULTIPLE VITAMINS-MINERALS PO Take 1 tablet by mouth daily.  . VENTOLIN HFA 108 (90 BASE) MCG/ACT inhaler  INHALE 2 PUFFS EVERY 4-6 HOURS AS NEEDED    Patient Care Team: Margaretann LovelessJennifer M Rolena Knutson, PA-C as PCP - General (Family Medicine)    Objective:   Vitals: There were no vitals taken for this visit.  Physical Exam  Constitutional: She is oriented to person, place, and time. She appears well-developed and well-nourished. No distress.  HENT:  Head: Normocephalic and atraumatic.  Right Ear: Hearing, tympanic membrane, external ear and ear canal normal.  Left Ear: Hearing, tympanic membrane, external ear and ear canal normal.  Nose: Nose normal.  Mouth/Throat: Uvula is midline, oropharynx is clear and moist and mucous membranes are normal. No oropharyngeal exudate.  Eyes: Conjunctivae, EOM and lids are normal. Pupils are equal, round, and reactive to light. Right eye exhibits no discharge. Left eye exhibits no discharge. No scleral icterus.  Neck: Trachea normal and normal range of motion. Neck supple. No JVD present. Carotid bruit is not present. No tracheal deviation present. No thyroid mass and no thyromegaly present.  Cardiovascular: Normal rate, regular rhythm, normal heart sounds and intact distal pulses.  Exam reveals no gallop and no friction rub.   No murmur heard. Pulmonary/Chest: Effort normal and breath sounds normal. No respiratory distress. She has no wheezes. She has no rales. She exhibits no tenderness. Right breast exhibits no inverted nipple, no mass, no nipple discharge, no skin change and no tenderness. Left breast exhibits no inverted nipple, no mass, no nipple discharge, no skin change and no tenderness. Breasts are symmetrical.  Abdominal: Soft. Normal appearance and bowel sounds are normal. She exhibits no distension and no mass. There is no hepatosplenomegaly. There is no tenderness. There is no rebound and no guarding.  Genitourinary: No breast swelling, tenderness or discharge.  Musculoskeletal: Normal range of motion. She exhibits no edema or tenderness.  Lymphadenopathy:      She has no cervical adenopathy.    She has no axillary adenopathy.  Neurological: She is alert and oriented to person, place, and time. She has normal strength. No cranial nerve deficit.  Skin: Skin is warm, dry and intact. No rash noted. She is not diaphoretic.  Psychiatric: She has a normal mood and affect. Her speech is normal and behavior is normal. Judgment and thought content normal. Cognition and memory are normal.  Vitals reviewed.   Activities of Daily Living In your present state of health, do you have any difficulty performing the following activities: 08/25/2016 02/17/2016  Hearing? N N  Vision? N N  Difficulty concentrating or making decisions? N N  Walking or climbing stairs? N N  Dressing or bathing? N N  Doing errands, shopping? N N  Some recent data might be hidden    Fall Risk Assessment Fall Risk  08/25/2016 02/17/2016  Falls in the past year? No No     Depression Screen PHQ 2/9 Scores 08/25/2016 02/17/2016  PHQ - 2 Score 0 0    Cognitive Testing -  6-CIT  Correct? Score   What year is it? yes 0 0 or 4  What month is it? yes 0 0 or 3  Memorize:    Floyde Parkins,  40,  [address],  [place],      What time is it? (within 1 hour) yes 0 0 or 3  Count backwards from 20 yes 0 0, 2, or 4  Name the months of the year no 2 0, 2, or 4  Repeat name & address above no 3 0, 2, 4, 6, 8, or 10       TOTAL SCORE  5/28   Interpretation:  Normal  Normal (0-7) Abnormal (8-28)   Audit-C Alcohol Use Screening  Question Answer Points  How often do you have alcoholic drink? once times daily 1  On days you do drink alcohol, how many drinks do you typically consume? 1 1  How oftey will you drink 6 or more in a total? never 0  Total Score:  2   A score of 3 or more in women, and 4 or more in men indicates increased risk for alcohol abuse, EXCEPT if all of the points are from question 1.     Assessment & Plan:     Annual Wellness Visit  Reviewed patient's Family  Medical History Reviewed and updated list of patient's medical providers Assessment of cognitive impairment was done Assessed patient's functional ability Established a written schedule for health screening services Health Risk Assessent Completed and Reviewed  Exercise Activities and Dietary recommendations Goals    None      Immunization History  Administered Date(s) Administered  . Pneumococcal Polysaccharide-23 02/18/2010  . Tdap 12/04/2009  . Zoster 08/24/2010    Health Maintenance  Topic Date Due  . Hepatitis C Screening  Apr 21, 1946  . MAMMOGRAM  10/26/1994  . COLONOSCOPY  10/26/1994  . DEXA SCAN  10/26/2009  . PNA vac Low Risk Adult (2 of 2 - PCV13) 02/19/2011  . INFLUENZA VACCINE  06/07/2016  . TETANUS/TDAP  12/05/2019  . ZOSTAVAX  Completed      Discussed health benefits of physical activity, and encouraged her to engage in regular exercise appropriate for her age and condition.    1. Medicare annual wellness visit, subsequent Normal physical exam.  2. Pure hypercholesterolemia Will check labs as below and f/u pending results. - Lipid Profile  3. Abnormal blood sugar Will check labs as below and f/u pending results. - Comprehensive Metabolic Panel (CMET)  4. Abnormal LFTs (liver function tests) Will check labs as below and f/u pending results. - Comprehensive Metabolic Panel (CMET)  5. Benign hypertension Will check labs as below and f/u pending results. - CBC w/Diff/Platelet  6. Iron deficiency anemia, unspecified iron deficiency anemia type Will check labs as below and f/u pending results. - Iron - Iron Binding Cap (TIBC)  7. Subclinical hypothyroidism Will check labs as below and f/u pending results. - TSH  ------------------------------------------------------------------------------------------------------------    Margaretann Loveless, PA-C  [facility]

## 2016-08-31 DIAGNOSIS — R7989 Other specified abnormal findings of blood chemistry: Secondary | ICD-10-CM | POA: Diagnosis not present

## 2016-08-31 DIAGNOSIS — D509 Iron deficiency anemia, unspecified: Secondary | ICD-10-CM | POA: Diagnosis not present

## 2016-08-31 DIAGNOSIS — R7309 Other abnormal glucose: Secondary | ICD-10-CM | POA: Diagnosis not present

## 2016-08-31 DIAGNOSIS — I10 Essential (primary) hypertension: Secondary | ICD-10-CM | POA: Diagnosis not present

## 2016-08-31 DIAGNOSIS — E7800 Pure hypercholesterolemia, unspecified: Secondary | ICD-10-CM | POA: Diagnosis not present

## 2016-08-31 DIAGNOSIS — E039 Hypothyroidism, unspecified: Secondary | ICD-10-CM | POA: Diagnosis not present

## 2016-09-01 ENCOUNTER — Telehealth: Payer: Self-pay

## 2016-09-01 LAB — COMPREHENSIVE METABOLIC PANEL
A/G RATIO: 1.9 (ref 1.2–2.2)
ALT: 64 IU/L — AB (ref 0–32)
AST: 85 IU/L — ABNORMAL HIGH (ref 0–40)
Albumin: 3.9 g/dL (ref 3.5–4.8)
Alkaline Phosphatase: 275 IU/L — ABNORMAL HIGH (ref 39–117)
BUN/Creatinine Ratio: 23 (ref 12–28)
BUN: 18 mg/dL (ref 8–27)
Bilirubin Total: 0.5 mg/dL (ref 0.0–1.2)
CALCIUM: 9.5 mg/dL (ref 8.7–10.3)
CO2: 26 mmol/L (ref 18–29)
Chloride: 105 mmol/L (ref 96–106)
Creatinine, Ser: 0.8 mg/dL (ref 0.57–1.00)
GFR, EST AFRICAN AMERICAN: 86 mL/min/{1.73_m2} (ref >59)
GFR, EST NON AFRICAN AMERICAN: 74 mL/min/{1.73_m2} (ref >59)
Globulin, Total: 2.1 g/dL (ref 1.5–4.5)
Glucose: 89 mg/dL (ref 65–99)
POTASSIUM: 3.9 mmol/L (ref 3.5–5.2)
Sodium: 145 mmol/L — ABNORMAL HIGH (ref 134–144)
TOTAL PROTEIN: 6 g/dL (ref 6.0–8.5)

## 2016-09-01 LAB — CBC WITH DIFFERENTIAL/PLATELET
BASOS: 1 %
Basophils Absolute: 0 10*3/uL (ref 0.0–0.2)
EOS (ABSOLUTE): 0.1 10*3/uL (ref 0.0–0.4)
Eos: 3 %
HEMATOCRIT: 40.7 % (ref 34.0–46.6)
Hemoglobin: 12.6 g/dL (ref 11.1–15.9)
IMMATURE GRANS (ABS): 0 10*3/uL (ref 0.0–0.1)
Immature Granulocytes: 0 %
Lymphocytes Absolute: 1.2 10*3/uL (ref 0.7–3.1)
Lymphs: 33 %
MCH: 26.3 pg — AB (ref 26.6–33.0)
MCHC: 31 g/dL — ABNORMAL LOW (ref 31.5–35.7)
MCV: 85 fL (ref 79–97)
MONOS ABS: 0.4 10*3/uL (ref 0.1–0.9)
Monocytes: 10 %
NEUTROS ABS: 2 10*3/uL (ref 1.4–7.0)
Neutrophils: 53 %
PLATELETS: 171 10*3/uL (ref 150–379)
RBC: 4.79 x10E6/uL (ref 3.77–5.28)
RDW: 21.2 % — AB (ref 12.3–15.4)
WBC: 3.7 10*3/uL (ref 3.4–10.8)

## 2016-09-01 LAB — IRON AND TIBC
IRON SATURATION: 14 % — AB (ref 15–55)
Iron: 53 ug/dL (ref 27–139)
Total Iron Binding Capacity: 378 ug/dL (ref 250–450)
UIBC: 325 ug/dL (ref 118–369)

## 2016-09-01 LAB — LIPID PANEL
CHOL/HDL RATIO: 2.6 ratio (ref 0.0–4.4)
CHOLESTEROL TOTAL: 136 mg/dL (ref 100–199)
HDL: 52 mg/dL (ref >39)
LDL CALC: 68 mg/dL (ref 0–99)
TRIGLYCERIDES: 80 mg/dL (ref 0–149)
VLDL CHOLESTEROL CAL: 16 mg/dL (ref 5–40)

## 2016-09-01 LAB — TSH: TSH: 3.42 u[IU]/mL (ref 0.450–4.500)

## 2016-09-01 NOTE — Telephone Encounter (Signed)
-----   Message from Margaretann LovelessJennifer M Burnette, PA-C sent at 09/01/2016  8:55 AM EDT ----- Labs are fairly stable. HgB is 12.6 which is much improved, was 10.7 at last check. May continue iron supplement once daily. Liver enzymes are stable.

## 2016-09-01 NOTE — Telephone Encounter (Signed)
Patient advised as below. Patient verbalizes understanding and is in agreement with treatment plan.  

## 2016-10-20 ENCOUNTER — Encounter: Payer: Self-pay | Admitting: Physician Assistant

## 2016-10-20 ENCOUNTER — Ambulatory Visit (INDEPENDENT_AMBULATORY_CARE_PROVIDER_SITE_OTHER): Payer: Medicare Other | Admitting: Physician Assistant

## 2016-10-20 VITALS — BP 112/70 | HR 63 | Temp 97.8°F | Resp 16 | Wt 138.6 lb

## 2016-10-20 DIAGNOSIS — R5383 Other fatigue: Secondary | ICD-10-CM

## 2016-10-20 DIAGNOSIS — E039 Hypothyroidism, unspecified: Secondary | ICD-10-CM | POA: Diagnosis not present

## 2016-10-20 DIAGNOSIS — H6121 Impacted cerumen, right ear: Secondary | ICD-10-CM

## 2016-10-20 DIAGNOSIS — R42 Dizziness and giddiness: Secondary | ICD-10-CM | POA: Diagnosis not present

## 2016-10-20 DIAGNOSIS — D509 Iron deficiency anemia, unspecified: Secondary | ICD-10-CM | POA: Diagnosis not present

## 2016-10-20 DIAGNOSIS — E038 Other specified hypothyroidism: Secondary | ICD-10-CM

## 2016-10-20 DIAGNOSIS — I639 Cerebral infarction, unspecified: Secondary | ICD-10-CM

## 2016-10-20 DIAGNOSIS — Z8673 Personal history of transient ischemic attack (TIA), and cerebral infarction without residual deficits: Secondary | ICD-10-CM

## 2016-10-20 DIAGNOSIS — R413 Other amnesia: Secondary | ICD-10-CM | POA: Diagnosis not present

## 2016-10-20 DIAGNOSIS — R202 Paresthesia of skin: Secondary | ICD-10-CM

## 2016-10-20 NOTE — Progress Notes (Signed)
Patient: Michele LinesMaureen A Lawrence Female    DOB: July 17, 1946   70 y.o.   MRN: [id number] Visit Date: 10/20/2016  Today's Provider: Margaretann LovelessJennifer M Demarie Uhlig, PA-C   Chief Complaint  Patient presents with  . Fatigue   Subjective:    HPI Patient is coming in today with c/o of feeling very fatigued.  Symptoms are fatigue, dizzy, weakness, light headedness, pin needles feeling in her left arm down to her fingers with some numbness. She also has h/o severe iron deficiency anemia and has been stable on oral ferrous sulfate 325mg  BID. She has also had a CVA in 12/2015 and states that the feelings she was having on Friday and then again today have been similar to when she had the stroke, just not as severe as that time. Her husband feels it is stress/anxiety induced being that symptoms always seem worse when she is stressed or anxious.  She reports that she was very stressed last Friday when she lost her pocket book and they looked everywhere for it. She finally found her pocket book at her son's house. She had items for christmas in her purse and was very upset because she thought she had left it at the grocery store and it had been stolen. She is becoming very forgetful per her and her husband's report. Husbands reports she hums more to herself now, especially when she is stressed, but also doing other things she enjoys, and appetite has decreased.  Her blood pressure readings at home has been 108/79-12/07/17;102/77-12/08/17;125/86-12/09/17 and today 116/79. She reports she is trying to stay well hydrated and is drinking water and tea.  Cognitive Testing - 6-CIT  Correct? Score   What year is it? yes 0 0 or 4  What month is it? yes 0 0 or 3  Memorize:    [place],  [place],  40,  [address],  [place],      What time is it? (within 1 hour) yes 0 0 or 3  Count backwards from 20 yes 0 0, 2, or 4  Name the months of the year yes 0 0, 2, or 4  Repeat name & address above no 3 0, 2, 4, 6, 8, or 10       TOTAL  SCORE  3/28   Interpretation:  Normal  Normal (0-7) Abnormal (8-28)      Allergies  Allergen Reactions  . Penicillins      Current Outpatient Prescriptions:  .  aspirin 81 MG tablet, Take 81 mg by mouth daily. , Disp: , Rfl:  .  docusate sodium (COLACE) 100 MG capsule, Take 1 capsule (100 mg total) by mouth 2 (two) times daily., Disp: 60 capsule, Rfl: 2 .  ferrous sulfate 325 (65 FE) MG tablet, Take 1 tablet (325 mg total) by mouth 2 (two) times daily with a meal., Disp: 60 tablet, Rfl: 3 .  ibuprofen (ADVIL) 200 MG tablet, Take 200 mg by mouth as needed. Reported on 03/18/2016, Disp: , Rfl:  .  losartan (COZAAR) 50 MG tablet, Take 50 mg by mouth., Disp: , Rfl:  .  MULTIPLE VITAMINS-MINERALS PO, Take 1 tablet by mouth daily., Disp: , Rfl:  .  acetaminophen (TYLENOL) 500 MG tablet, Take 500 mg by mouth every 6 (six) hours as needed for moderate pain or headache. Reported on 03/18/2016, Disp: , Rfl:  .  atorvastatin (LIPITOR) 80 MG tablet, Take 1 tablet (80 mg total) by mouth daily at 6 PM., Disp: 90 tablet, Rfl:  3 .  loratadine (CLARITIN) 10 MG tablet, Take 1 tablet by mouth daily as needed. Reported on 03/18/2016, Disp: , Rfl:  .  VENTOLIN HFA 108 (90 BASE) MCG/ACT inhaler, INHALE 2 PUFFS EVERY 4-6 HOURS AS NEEDED (Patient not taking: Reported on 10/20/2016), Disp: 36 g, Rfl: 2  Review of Systems  Constitutional: Positive for activity change, appetite change and fatigue.  Respiratory: Negative for chest tightness and shortness of breath.   Cardiovascular: Negative for chest pain, palpitations and leg swelling.  Neurological: Positive for dizziness, weakness, light-headedness, numbness and headaches.    Social History  Substance Use Topics  . Smoking status: Never Smoker  . Smokeless tobacco: Never Used  . Alcohol use Yes     Comment: 1/2 glass of wine qhs   Objective:   BP 112/70 (BP Location: Right Arm, Patient Position: Sitting, Cuff Size: Normal)   Pulse 63   Temp 97.8 F  (36.6 C) (Oral)   Resp 16   Wt 138 lb 9.6 oz (62.9 kg)   SpO2 99%   BMI 23.79 kg/m   Physical Exam  Constitutional: She is oriented to person, place, and time. She appears well-developed and well-nourished. She appears lethargic. No distress.  HENT:  Head: Normocephalic and atraumatic.  Right Ear: Tympanic membrane, external ear and ear canal normal.  Left Ear: Tympanic membrane, external ear and ear canal normal.  Nose: Nose normal. Right sinus exhibits no maxillary sinus tenderness and no frontal sinus tenderness. Left sinus exhibits no maxillary sinus tenderness and no frontal sinus tenderness.  Mouth/Throat: Uvula is midline, oropharynx is clear and moist and mucous membranes are normal. No oropharyngeal exudate.  Right cerumen impaction noted, lavage successful and right TM visualized and normal.  Eyes: Conjunctivae and EOM are normal. Pupils are equal, round, and reactive to light. Right eye exhibits no discharge. Left eye exhibits no discharge. No scleral icterus. Right eye exhibits no nystagmus. Left eye exhibits no nystagmus.  Neck: Normal range of motion. Neck supple. No JVD present. Carotid bruit is not present. No tracheal deviation present. No thyromegaly present.  Cardiovascular: Normal rate, regular rhythm, normal heart sounds and intact distal pulses.  Exam reveals no gallop and no friction rub.   No murmur heard. Pulmonary/Chest: Effort normal and breath sounds normal. No respiratory distress. She has no wheezes. She has no rales. She exhibits no tenderness.  Abdominal: Soft. Bowel sounds are normal. She exhibits no distension and no mass. There is no tenderness. There is no rebound and no guarding.  Musculoskeletal: Normal range of motion. She exhibits no edema or tenderness.  Lymphadenopathy:    She has no cervical adenopathy.  Neurological: She is oriented to person, place, and time. She has normal strength. She appears lethargic. No cranial nerve deficit or sensory  deficit. Gait (slowed gait) abnormal. Coordination normal.  Skin: Skin is warm and dry. No rash noted. She is not diaphoretic.  Psychiatric: Her speech is normal and behavior is normal. Judgment and thought content normal. Her mood appears anxious. Cognition and memory are normal.  Vitals reviewed.      Assessment & Plan:     1. Other fatigue Unknown source at this time. Possible anxiety component. I will check labs as below being that she has had severe iron deficiency with similar symptoms in the past. I will follow-up pending these results. - CBC w/Diff/Platelet - Comprehensive Metabolic Panel (CMET) - Iron - Iron Binding Cap (TIBC)  2. Iron deficiency anemia, unspecified iron deficiency anemia type See  above medical treatment plan. - CBC w/Diff/Platelet - Comprehensive Metabolic Panel (CMET) - Iron - Iron Binding Cap (TIBC)  3. Subclinical hypothyroidism Will check labs as below and f/u pending results. - TSH  4. Dizziness Advised patient to make sure to stay well-hydrated as she does have some hypotension when she has worsening dizziness. Dizziness most often occurs with change of position. I have ordered a CT of the head as below to make sure there are no new lesions noted. Patient wants to make sure that she has not had another stroke stating the symptoms she had on Friday were very similar to when she had her stroke in February. Will check labs as below and f/u pending results. - CBC w/Diff/Platelet - Comprehensive Metabolic Panel (CMET) - Iron - Iron Binding Cap (TIBC) - CT Head Wo Contrast; Future  5. Tingling of left arm and left side of face See above medical treatment plan. - Iron - Iron Binding Cap (TIBC) - CT Head Wo Contrast; Future  6. H/O: stroke See above medical treatment plan. - CT Head Wo Contrast; Future  7. Impacted cerumen of right ear Ear lavage successful. - Ear Lavage  8. Memory loss of unknown cause CIT 6 was normal today in the office.  Memory has worsened with anxiety during the season. She has been doing a lot more recently for the holidays. Her husband thinks that symptoms are most closely related with anxiety and stress. I did offer for her to go for neurocognitive testing at low [facility] neurology but her and her husband both refused stating that this will insight more stress and could make symptoms worse.       Margaretann Loveless, PA-C  [facility]

## 2016-10-20 NOTE — Patient Instructions (Signed)
Dizziness Dizziness is a common problem. It is a feeling of unsteadiness or light-headedness. You may feel like you are about to faint. Dizziness can lead to injury if you stumble or fall. Anyone can become dizzy, but dizziness is more common in older adults. This condition can be caused by a number of things, including medicines, dehydration, or illness. Follow these instructions at home: Taking these steps may help with your condition: Eating and drinking   Drink enough fluid to keep your urine clear or pale yellow. This helps to keep you from becoming dehydrated. Try to drink more clear fluids, such as water.  Do not drink alcohol.  Limit your caffeine intake if directed by your health care provider.  Limit your salt intake if directed by your health care provider. Activity   Avoid making quick movements.  Rise slowly from chairs and steady yourself until you feel okay.  In the morning, first sit up on the side of the bed. When you feel okay, stand slowly while you hold onto something until you know that your balance is fine.  Move your legs often if you need to stand in one place for a long time. Tighten and relax your muscles in your legs while you are standing.  Do not drive or operate heavy machinery if you feel dizzy.  Avoid bending down if you feel dizzy. Place items in your home so that they are easy for you to reach without leaning over. Lifestyle   Do not use any tobacco products, including cigarettes, chewing tobacco, or electronic cigarettes. If you need help quitting, ask your health care provider.  Try to reduce your stress level, such as with yoga or meditation. Talk with your health care provider if you need help. General instructions   Watch your dizziness for any changes.  Take medicines only as directed by your health care provider. Talk with your health care provider if you think that your dizziness is caused by a medicine that you are taking.  Tell a friend  or a family member that you are feeling dizzy. If he or she notices any changes in your behavior, have this person call your health care provider.  Keep all follow-up visits as directed by your health care provider. This is important. Contact a health care provider if:  Your dizziness does not go away.  Your dizziness or light-headedness gets worse.  You feel nauseous.  You have reduced hearing.  You have new symptoms.  You are unsteady on your feet or you feel like the room is spinning. Get help right away if:  You vomit or have diarrhea and are unable to eat or drink anything.  You have problems talking, walking, swallowing, or using your arms, hands, or legs.  You feel generally weak.  You are not thinking clearly or you have trouble forming sentences. It may take a friend or family member to notice this.  You have chest pain, abdominal pain, shortness of breath, or sweating.  Your vision changes.  You notice any bleeding.  You have a headache.  You have neck pain or a stiff neck.  You have a fever. This information is not intended to replace advice given to you by your health care provider. Make sure you discuss any questions you have with your health care provider. Document Released: 04/19/2001 Document Revised: 03/31/2016 Document Reviewed: 10/20/2014 Elsevier Interactive Patient Education  2017 Elsevier Inc.  

## 2016-10-21 ENCOUNTER — Telehealth: Payer: Self-pay

## 2016-10-21 DIAGNOSIS — F419 Anxiety disorder, unspecified: Secondary | ICD-10-CM

## 2016-10-21 LAB — CBC WITH DIFFERENTIAL/PLATELET
BASOS: 1 %
Basophils Absolute: 0 10*3/uL (ref 0.0–0.2)
EOS (ABSOLUTE): 0.1 10*3/uL (ref 0.0–0.4)
EOS: 1 %
HEMATOCRIT: 47.4 % — AB (ref 34.0–46.6)
Hemoglobin: 15.3 g/dL (ref 11.1–15.9)
IMMATURE GRANS (ABS): 0 10*3/uL (ref 0.0–0.1)
Immature Granulocytes: 0 %
LYMPHS: 24 %
Lymphocytes Absolute: 1.2 10*3/uL (ref 0.7–3.1)
MCH: 28.9 pg (ref 26.6–33.0)
MCHC: 32.3 g/dL (ref 31.5–35.7)
MCV: 90 fL (ref 79–97)
Monocytes Absolute: 0.4 10*3/uL (ref 0.1–0.9)
Monocytes: 8 %
NEUTROS ABS: 3.3 10*3/uL (ref 1.4–7.0)
NEUTROS PCT: 66 %
Platelets: 197 10*3/uL (ref 150–379)
RBC: 5.29 x10E6/uL — ABNORMAL HIGH (ref 3.77–5.28)
RDW: 17.7 % — ABNORMAL HIGH (ref 12.3–15.4)
WBC: 5.1 10*3/uL (ref 3.4–10.8)

## 2016-10-21 LAB — COMPREHENSIVE METABOLIC PANEL
A/G RATIO: 2.1 (ref 1.2–2.2)
ALBUMIN: 4.5 g/dL (ref 3.5–4.8)
ALT: 53 IU/L — ABNORMAL HIGH (ref 0–32)
AST: 75 IU/L — ABNORMAL HIGH (ref 0–40)
Alkaline Phosphatase: 235 IU/L — ABNORMAL HIGH (ref 39–117)
BUN / CREAT RATIO: 19 (ref 12–28)
BUN: 16 mg/dL (ref 8–27)
Bilirubin Total: 0.8 mg/dL (ref 0.0–1.2)
CALCIUM: 10.3 mg/dL (ref 8.7–10.3)
CO2: 25 mmol/L (ref 18–29)
Chloride: 103 mmol/L (ref 96–106)
Creatinine, Ser: 0.86 mg/dL (ref 0.57–1.00)
GFR, EST AFRICAN AMERICAN: 79 mL/min/{1.73_m2} (ref >59)
GFR, EST NON AFRICAN AMERICAN: 68 mL/min/{1.73_m2} (ref >59)
GLOBULIN, TOTAL: 2.1 g/dL (ref 1.5–4.5)
Glucose: 103 mg/dL — ABNORMAL HIGH (ref 65–99)
POTASSIUM: 4.2 mmol/L (ref 3.5–5.2)
SODIUM: 145 mmol/L — AB (ref 134–144)
TOTAL PROTEIN: 6.6 g/dL (ref 6.0–8.5)

## 2016-10-21 LAB — TSH: TSH: 2.07 u[IU]/mL (ref 0.450–4.500)

## 2016-10-21 LAB — IRON AND TIBC
IRON SATURATION: 62 % — AB (ref 15–55)
IRON: 216 ug/dL — AB (ref 27–139)
TIBC: 350 ug/dL (ref 250–450)
UIBC: 134 ug/dL (ref 118–369)

## 2016-10-21 MED ORDER — LORAZEPAM 0.5 MG PO TABS: ORAL_TABLET | ORAL | 0 refills | Status: DC

## 2016-10-21 NOTE — Telephone Encounter (Signed)
LMTCB Emily Drozdowski, CMA  

## 2016-10-21 NOTE — Telephone Encounter (Signed)
Pt's husband advised of results. Husband reports pt is anxious about upcoming CT scan on 10/24/2016 at 1:00 pm. Husband states pt is claustrophobic and is requesting something to help with this. [employer] [place]. Please advise. Allene DillonEmily Drozdowski, CMA

## 2016-10-21 NOTE — Telephone Encounter (Signed)
Sent in lorazepam 0.5mg  to take 1 tab PO 30 min prior to testing.

## 2016-10-21 NOTE — Telephone Encounter (Signed)
Called in rx and informed pt's husband. Allene DillonEmily Drozdowski, CMA

## 2016-10-21 NOTE — Telephone Encounter (Signed)
-----   Message from Margaretann LovelessJennifer M Burnette, PA-C sent at 10/21/2016  8:19 AM EST ----- Labs were stable and WNL. Iron and HgB levels are doing very well. Both are WNL. TSH was normal, sugar was normal. Liver enzymes are improving still and the lowest they have been since February.

## 2016-10-24 ENCOUNTER — Ambulatory Visit
Admission: RE | Admit: 2016-10-24 | Discharge: 2016-10-24 | Disposition: A | Discharge status: Home / Self Care | Payer: Medicare Other | Source: Ambulatory Visit | Attending: Physician Assistant | Admitting: Physician Assistant

## 2016-10-24 ENCOUNTER — Telehealth: Payer: Self-pay

## 2016-10-24 DIAGNOSIS — R42 Dizziness and giddiness: Secondary | ICD-10-CM | POA: Diagnosis not present

## 2016-10-24 DIAGNOSIS — Z8673 Personal history of transient ischemic attack (TIA), and cerebral infarction without residual deficits: Secondary | ICD-10-CM | POA: Diagnosis not present

## 2016-10-24 DIAGNOSIS — R531 Weakness: Secondary | ICD-10-CM | POA: Diagnosis not present

## 2016-10-24 DIAGNOSIS — R202 Paresthesia of skin: Secondary | ICD-10-CM

## 2016-10-24 NOTE — Telephone Encounter (Signed)
Pt advised. Emily Drozdowski, CMA  

## 2016-10-24 NOTE — Telephone Encounter (Signed)
lmtcb Emily Drozdowski, CMA  

## 2016-10-24 NOTE — Telephone Encounter (Signed)
-----   Message from Margaretann LovelessJennifer M Burnette, [place]-C sent at 10/24/2016  3:07 PM EST ----- CT Head unremarkable. No changes noted from previous.

## 2016-12-21 ENCOUNTER — Other Ambulatory Visit: Payer: Self-pay

## 2016-12-21 MED ORDER — ATORVASTATIN CALCIUM 80 MG PO TABS: 80.0000 mg | ORAL_TABLET | Freq: Every day | ORAL | 3 refills | Status: DC

## 2016-12-21 NOTE — Telephone Encounter (Signed)
Refill Request  90 day supply

## 2017-06-15 ENCOUNTER — Ambulatory Visit
Admission: AD | Admit: 2017-06-15 | Discharge: 2017-06-15 | Disposition: A | Discharge status: Home / Self Care | Payer: Medicare Other | Source: Ambulatory Visit | Attending: Physician Assistant | Admitting: Physician Assistant

## 2017-06-15 ENCOUNTER — Ambulatory Visit (INDEPENDENT_AMBULATORY_CARE_PROVIDER_SITE_OTHER): Payer: Medicare Other | Admitting: Physician Assistant

## 2017-06-15 ENCOUNTER — Telehealth: Payer: Self-pay | Admitting: Family Medicine

## 2017-06-15 ENCOUNTER — Ambulatory Visit
Admission: RE | Admit: 2017-06-15 | Discharge: 2017-06-15 | Disposition: A | Discharge status: Home / Self Care | Payer: Medicare Other | Source: Ambulatory Visit | Attending: Physician Assistant | Admitting: Physician Assistant

## 2017-06-15 VITALS — BP 120/70 | HR 61 | Temp 97.7°F | Resp 20 | Wt 135.0 lb

## 2017-06-15 DIAGNOSIS — R079 Chest pain, unspecified: Secondary | ICD-10-CM | POA: Diagnosis not present

## 2017-06-15 DIAGNOSIS — R0602 Shortness of breath: Secondary | ICD-10-CM | POA: Diagnosis not present

## 2017-06-15 DIAGNOSIS — D508 Other iron deficiency anemias: Secondary | ICD-10-CM

## 2017-06-15 DIAGNOSIS — R42 Dizziness and giddiness: Secondary | ICD-10-CM

## 2017-06-15 DIAGNOSIS — I444 Left anterior fascicular block: Secondary | ICD-10-CM | POA: Diagnosis not present

## 2017-06-15 MED ORDER — MECLIZINE HCL 25 MG PO TABS: 25.0000 mg | ORAL_TABLET | Freq: Three times a day (TID) | ORAL | 0 refills | Status: DC | PRN

## 2017-06-15 NOTE — Telephone Encounter (Signed)
Stat CXR report called to the doctor on-call Team Health Nurse will inform patient there were no worrisome findings, and to follow whatever instructions her provided gave her earlier

## 2017-06-15 NOTE — Progress Notes (Signed)
[name] Female    DOB: 08/28/1946   71 y.o.   MRN: [id number] Visit Date: 06/15/2017  Today's Provider: Margaretann Loveless, PA-C   Chief Complaint  Patient presents with  . Chest Pain   Subjective:    HPI  Patient here today C/O left rib cage pain, dizziness started this morning. Patient reports that pain is worse with activity and patient reports having some shortness of breath. Patient reports pain and numbness on left arm. Patient denies any fever, cough or any injuries.  She has had a stroke in the past and has also had severe iron def anemia that had caused similar symptoms approx 1 year ago that also affected the left side. She does report this does not feel similar to her iron def.     Allergies  Allergen Reactions  . Penicillins      Current Outpatient Prescriptions:  .  acetaminophen (TYLENOL) 500 MG tablet, Take 500 mg by mouth every 6 (six) hours as needed for moderate pain or headache. Reported on 03/18/2016, Disp: , Rfl:  .  aspirin 81 MG tablet, Take 81 mg by mouth daily. , Disp: , Rfl:  .  atorvastatin (LIPITOR) 80 MG tablet, Take 1 tablet (80 mg total) by mouth daily at 6 PM., Disp: 90 tablet, Rfl: 3 .  ferrous sulfate 325 (65 FE) MG tablet, Take 1 tablet (325 mg total) by mouth 2 (two) times daily with a meal., Disp: 60 tablet, Rfl: 3 .  ibuprofen (ADVIL) 200 MG tablet, Take 200 mg by mouth as needed. Reported on 03/18/2016, Disp: , Rfl:  .  loratadine (CLARITIN) 10 MG tablet, Take 1 tablet by mouth daily as needed. Reported on 03/18/2016, Disp: , Rfl:  .  LORazepam (ATIVAN) 0.5 MG tablet, Take 1 tab PO 30 min prior to testing, Disp: 1 tablet, Rfl: 0 .  MULTIPLE VITAMINS-MINERALS PO, Take 1 tablet by mouth daily., Disp: , Rfl:  .  VENTOLIN HFA 108 (90 BASE) MCG/ACT inhaler, INHALE 2 PUFFS EVERY 4-6 HOURS AS NEEDED (Patient not taking: Reported on 10/20/2016), Disp: 36 g, Rfl: 2  Review of Systems  Constitutional: Positive for fatigue.    Respiratory: Positive for shortness of breath. Negative for cough, chest tightness and wheezing.   Cardiovascular: Positive for chest pain. Negative for palpitations and leg swelling.  Gastrointestinal: Negative for abdominal pain, constipation, diarrhea, nausea and vomiting.  Musculoskeletal: Negative for back pain.  Neurological: Positive for dizziness, weakness, light-headedness and numbness. Negative for facial asymmetry.    Social History  Substance Use Topics  . Smoking status: Never Smoker  . Smokeless tobacco: Never Used  . Alcohol use Yes     Comment: 1/2 glass of wine qhs   Objective:   BP 120/70 (BP Location: Right Arm, Patient Position: Sitting, Cuff Size: Normal)   Pulse 61   Temp 97.7 F (36.5 C)   Resp 20   Wt 135 lb (61.2 kg)   SpO2 99%   BMI 23.17 kg/m  Vitals:   06/15/17 1525  BP: 120/70  Pulse: 61  Resp: 20  Temp: 97.7 F (36.5 C)  SpO2: 99%  Weight: 135 lb (61.2 kg)     Physical Exam  Constitutional: She is oriented to person, place, and time. She appears well-developed and well-nourished. No distress.  Neck: Normal range of motion. Neck supple. No JVD present. No tracheal deviation present. No thyromegaly present.  Cardiovascular: Normal rate, regular rhythm and normal heart  sounds.  Exam reveals no gallop and no friction rub.   No murmur heard. Pulmonary/Chest: Effort normal and breath sounds normal. No respiratory distress. She has no wheezes. She has no rales.  Abdominal: Soft. Bowel sounds are normal. She exhibits no distension and no mass. There is no tenderness.  Musculoskeletal: She exhibits no edema.  Lymphadenopathy:    She has no cervical adenopathy.  Neurological: She is alert and oriented to person, place, and time. She has normal strength. No cranial nerve deficit or sensory deficit. Gait abnormal. Coordination normal.  Positive Romberg  Skin: She is not diaphoretic.  Psychiatric: She has a normal mood and affect. Her behavior is  normal. Judgment and thought content normal.  Vitals reviewed.       Assessment & Plan:     1. Left sided chest pain EKG obtained today and shows left anterior fasicular block stable from 01/19/16 with a rate of 72, no ST changes. Unlikely cardiac but do question pulmonary source. DDx: pleurisy, pneumonia, PE, iron def anemia. I will check labs and CXR as below. I will f/u pending results.  *Addend: patient had elevated D dimer and was not feeling much better the following morning. I advised her to go to the ER to be evaluated for PE.   - EKG 12-Lead - CBC w/Diff/Platelet - Comprehensive Metabolic Panel (CMET) - D-dimer, quantitative ([facility]) - Iron Binding Cap (TIBC) - DG Chest 2 View; Future  2. SOB (shortness of breath) See above medical treatment plan. - EKG 12-Lead - CBC w/Diff/Platelet - Comprehensive Metabolic Panel (CMET) - D-dimer, quantitative ([facility]) - Iron Binding Cap (TIBC) - DG Chest 2 View; Future  3. Dizziness See above medical treatment plan. - EKG 12-Lead - CBC w/Diff/Platelet - Comprehensive Metabolic Panel (CMET) - D-dimer, quantitative ([facility]) - Iron Binding Cap (TIBC) - meclizine (ANTIVERT) 25 MG tablet; Take 1 tablet (25 mg total) by mouth 3 (three) times daily as needed for dizziness.  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 Previously seen by cardiology, Dr. Kirke CorinArida and Dr. Alvino ChapelIngal.  5. Other iron deficiency anemia H/O this requiring infusion treatments. I will check labs as below to make sure stable.  - Iron Binding Cap (TIBC)       Michele LovelessJennifer M Alida Greiner, PA-C  [facility]

## 2017-06-15 NOTE — Patient Instructions (Signed)
Dizziness Dizziness is a common problem. It is a feeling of unsteadiness or light-headedness. You may feel like you are about to faint. Dizziness can lead to injury if you stumble or fall. Anyone can become dizzy, but dizziness is more common in older adults. This condition can be caused by a number of things, including medicines, dehydration, or illness. Follow these instructions at home: Taking these steps may help with your condition: Eating and drinking   Drink enough fluid to keep your urine clear or pale yellow. This helps to keep you from becoming dehydrated. Try to drink more clear fluids, such as water.  Do not drink alcohol.  Limit your caffeine intake if directed by your health care provider.  Limit your salt intake if directed by your health care provider. Activity   Avoid making quick movements.  Rise slowly from chairs and steady yourself until you feel okay.  In the morning, first sit up on the side of the bed. When you feel okay, stand slowly while you hold onto something until you know that your balance is fine.  Move your legs often if you need to stand in one place for a long time. Tighten and relax your muscles in your legs while you are standing.  Do not drive or operate heavy machinery if you feel dizzy.  Avoid bending down if you feel dizzy. Place items in your home so that they are easy for you to reach without leaning over. Lifestyle   Do not use any tobacco products, including cigarettes, chewing tobacco, or electronic cigarettes. If you need help quitting, ask your health care provider.  Try to reduce your stress level, such as with yoga or meditation. Talk with your health care provider if you need help. General instructions   Watch your dizziness for any changes.  Take medicines only as directed by your health care provider. Talk with your health care provider if you think that your dizziness is caused by a medicine that you are taking.  Tell a friend  or a family member that you are feeling dizzy. If he or she notices any changes in your behavior, have this person call your health care provider.  Keep all follow-up visits as directed by your health care provider. This is important. Contact a health care provider if:  Your dizziness does not go away.  Your dizziness or light-headedness gets worse.  You feel nauseous.  You have reduced hearing.  You have new symptoms.  You are unsteady on your feet or you feel like the room is spinning. Get help right away if:  You vomit or have diarrhea and are unable to eat or drink anything.  You have problems talking, walking, swallowing, or using your arms, hands, or legs.  You feel generally weak.  You are not thinking clearly or you have trouble forming sentences. It may take a friend or family member to notice this.  You have chest pain, abdominal pain, shortness of breath, or sweating.  Your vision changes.  You notice any bleeding.  You have a headache.  You have neck pain or a stiff neck.  You have a fever. This information is not intended to replace advice given to you by your health care provider. Make sure you discuss any questions you have with your health care provider. Document Released: 04/19/2001 Document Revised: 03/31/2016 Document Reviewed: 10/20/2014 Elsevier Interactive Patient Education  2017 Elsevier Inc.  

## 2017-06-16 ENCOUNTER — Emergency Department: Payer: Medicare Other

## 2017-06-16 ENCOUNTER — Encounter: Payer: Self-pay | Admitting: Physician Assistant

## 2017-06-16 ENCOUNTER — Encounter: Payer: Self-pay | Admitting: Emergency Medicine

## 2017-06-16 ENCOUNTER — Emergency Department
Admission: EM | Admit: 2017-06-16 | Discharge: 2017-06-16 | Disposition: A | Discharge status: Home / Self Care | Payer: Medicare Other | Attending: Emergency Medicine | Admitting: Emergency Medicine

## 2017-06-16 DIAGNOSIS — J45909 Unspecified asthma, uncomplicated: Secondary | ICD-10-CM | POA: Diagnosis not present

## 2017-06-16 DIAGNOSIS — Z8673 Personal history of transient ischemic attack (TIA), and cerebral infarction without residual deficits: Secondary | ICD-10-CM | POA: Insufficient documentation

## 2017-06-16 DIAGNOSIS — R091 Pleurisy: Secondary | ICD-10-CM

## 2017-06-16 DIAGNOSIS — R0782 Intercostal pain: Secondary | ICD-10-CM | POA: Insufficient documentation

## 2017-06-16 DIAGNOSIS — R079 Chest pain, unspecified: Secondary | ICD-10-CM | POA: Diagnosis not present

## 2017-06-16 DIAGNOSIS — R0602 Shortness of breath: Secondary | ICD-10-CM | POA: Diagnosis present

## 2017-06-16 DIAGNOSIS — I10 Essential (primary) hypertension: Secondary | ICD-10-CM | POA: Insufficient documentation

## 2017-06-16 DIAGNOSIS — Z7982 Long term (current) use of aspirin: Secondary | ICD-10-CM | POA: Diagnosis not present

## 2017-06-16 DIAGNOSIS — Z79899 Other long term (current) drug therapy: Secondary | ICD-10-CM | POA: Diagnosis not present

## 2017-06-16 LAB — CBC WITH DIFFERENTIAL/PLATELET
Basophils Absolute: 0 10*3/uL (ref 0.0–0.2)
Basos: 0 %
EOS (ABSOLUTE): 0.1 10*3/uL (ref 0.0–0.4)
Eos: 1 %
HEMATOCRIT: 40.6 % (ref 34.0–46.6)
Hemoglobin: 14 g/dL (ref 11.1–15.9)
IMMATURE GRANULOCYTES: 0 %
Immature Grans (Abs): 0 10*3/uL (ref 0.0–0.1)
LYMPHS ABS: 1 10*3/uL (ref 0.7–3.1)
Lymphs: 21 %
MCH: 32.8 pg (ref 26.6–33.0)
MCHC: 34.5 g/dL (ref 31.5–35.7)
MCV: 95 fL (ref 79–97)
Monocytes Absolute: 0.5 10*3/uL (ref 0.1–0.9)
Monocytes: 10 %
Neutrophils Absolute: 3.3 10*3/uL (ref 1.4–7.0)
Neutrophils: 68 %
Platelets: 183 10*3/uL (ref 150–379)
RBC: 4.27 x10E6/uL (ref 3.77–5.28)
RDW: 13.5 % (ref 12.3–15.4)
WBC: 4.9 10*3/uL (ref 3.4–10.8)

## 2017-06-16 LAB — CBC
HCT: 41.4 % (ref 35.0–47.0)
HEMOGLOBIN: 14.1 g/dL (ref 12.0–16.0)
MCH: 33.6 pg (ref 26.0–34.0)
MCHC: 34.2 g/dL (ref 32.0–36.0)
MCV: 98.4 fL (ref 80.0–100.0)
Platelets: 164 10*3/uL (ref 150–440)
RBC: 4.21 MIL/uL (ref 3.80–5.20)
RDW: 13.4 % (ref 11.5–14.5)
WBC: 3.7 10*3/uL (ref 3.6–11.0)

## 2017-06-16 LAB — COMPREHENSIVE METABOLIC PANEL
ALBUMIN: 4 g/dL (ref 3.5–4.8)
ALBUMIN: 3.7 g/dL (ref 3.5–5.0)
ALT: 42 IU/L — ABNORMAL HIGH (ref 0–32)
ALT: 40 U/L (ref 14–54)
ANION GAP: 6 (ref 5–15)
AST: 62 IU/L — ABNORMAL HIGH (ref 0–40)
AST: 65 U/L — AB (ref 15–41)
Albumin/Globulin Ratio: 2 (ref 1.2–2.2)
Alkaline Phosphatase: 139 IU/L — ABNORMAL HIGH (ref 39–117)
Alkaline Phosphatase: 109 U/L (ref 38–126)
BUN / CREAT RATIO: 22 (ref 12–28)
BUN: 17 mg/dL (ref 8–27)
BUN: 18 mg/dL (ref 6–20)
Bilirubin Total: 0.7 mg/dL (ref 0.0–1.2)
CALCIUM: 9.6 mg/dL (ref 8.7–10.3)
CHLORIDE: 110 mmol/L (ref 101–111)
CO2: 25 mmol/L (ref 20–29)
CO2: 30 mmol/L (ref 22–32)
CREATININE: 0.76 mg/dL (ref 0.57–1.00)
Calcium: 9.7 mg/dL (ref 8.9–10.3)
Chloride: 107 mmol/L — ABNORMAL HIGH (ref 96–106)
Creatinine, Ser: 0.81 mg/dL (ref 0.44–1.00)
GFR calc Af Amer: >60 mL/min (ref >60)
GFR calc non Af Amer: 79 mL/min/{1.73_m2} (ref >59)
GFR calc non Af Amer: >60 mL/min (ref >60)
GFR, EST AFRICAN AMERICAN: 91 mL/min/{1.73_m2} (ref >59)
GLOBULIN, TOTAL: 2 g/dL (ref 1.5–4.5)
GLUCOSE: 72 mg/dL (ref 65–99)
Glucose: 96 mg/dL (ref 65–99)
POTASSIUM: 3.6 mmol/L (ref 3.5–5.1)
Potassium: 4.1 mmol/L (ref 3.5–5.2)
SODIUM: 144 mmol/L (ref 134–144)
SODIUM: 146 mmol/L — AB (ref 135–145)
TOTAL PROTEIN: 6 g/dL (ref 6.0–8.5)
Total Bilirubin: 1 mg/dL (ref 0.3–1.2)
Total Protein: 6.1 g/dL — ABNORMAL LOW (ref 6.5–8.1)

## 2017-06-16 LAB — IRON AND TIBC
IRON: 119 ug/dL (ref 27–139)
Iron Saturation: 36 % (ref 15–55)
TIBC: 327 ug/dL (ref 250–450)
UIBC: 208 ug/dL (ref 118–369)

## 2017-06-16 LAB — D-DIMER, QUANTITATIVE (NOT AT ARMC): D-DIMER: 1.37 mg{FEU}/L — AB (ref 0.00–0.49)

## 2017-06-16 LAB — TROPONIN I: Troponin I: <0.03 ng/mL (ref <0.03)

## 2017-06-16 MED ORDER — IOPAMIDOL (ISOVUE-370) INJECTION 76%
75.0000 mL | Freq: Once | INTRAVENOUS | Status: AC | PRN
  Administered 2017-06-16: 75 mL via INTRAVENOUS

## 2017-06-16 NOTE — ED Provider Notes (Signed)
[facility] Emergency Department Provider Note   ____________________________________________    I have reviewed the triage vital signs and the nursing notes.   HISTORY  Chief Complaint Shortness of Breath     HPI Michele Lawrence is a 71 y.o. female who presents with complaints of pleurisy and shortness of breath. Patient reports yesterday she felt like she was "huffing and puffing" and had pain in her left lateral chest. She saw her PCP who apparently sent labs including a d-dimer. An x-ray was performed which was reportedly normal. Patient was notified today that her d-dimer was elevated and to come to the emergency department to rule out pulmonary embolism. Patient denies recent travel. No history of DVT. No calf pain or swelling. No fevers chills nausea or vomiting or diaphoresis. No history of heart disease. Pain only occurs when she takes a deep breath.   Past Medical History:  Diagnosis Date  . Asthma   . Hyperlipidemia   . Hypertension   . Stroke [facility])     Patient Active Problem List   Diagnosis Date Noted  . Abnormal LFTs (liver function tests) 03/09/2016  . Fatigue 03/09/2016  . Abnormal weight loss 03/09/2016  . Palpitations 01/19/2016  . Shortness of breath 01/19/2016  . 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 Ischemic stroke (HCC) 12/14/2015  . Subclinical hypothyroidism 07/17/2015  . Calcium blood increased 07/17/2015  . Belching 07/17/2015  . Airway hyperreactivity 07/17/2015  . Allergic rhinitis 07/17/2015  . Abnormal blood sugar 02/18/2010  . Herpes zona 06/15/2009  . Family history of breast cancer 12/22/2007  . Benign hypertension 12/21/2007  . HLD (hyperlipidemia) 04/17/2007    Past Surgical History:  Procedure Laterality Date  . APPENDECTOMY    . TONSILLECTOMY      Prior to Admission medications   Medication Sig Start Date End Date Taking? Authorizing Provider  aspirin 81 MG tablet Take 81 mg by  mouth daily.  12/21/07  Yes [provider]  atorvastatin (LIPITOR) 80 MG tablet Take 1 tablet (80 mg total) by mouth daily at 6 PM. 12/21/16 08/25/17 Yes Burnette, Alessandra Bevels, PA-C  ferrous sulfate 325 (65 FE) MG tablet Take 1 tablet (325 mg total) by mouth 2 (two) times daily with a meal. 06/30/16  Yes Burnette, Jennifer M, PA-C  loratadine (CLARITIN) 10 MG tablet Take 1 tablet by mouth daily as needed. Reported on 03/18/2016 02/01/12  Yes [provider]  meclizine (ANTIVERT) 25 MG tablet Take 1 tablet (25 mg total) by mouth 3 (three) times daily as needed for dizziness. 06/15/17  Yes Margaretann Loveless, PA-C  MULTIPLE VITAMINS-MINERALS PO Take 1 tablet by mouth daily.   Yes [provider]  acetaminophen (TYLENOL) 500 MG tablet Take 500 mg by mouth every 6 (six) hours as needed for moderate pain or headache. Reported on 03/18/2016    [provider]  ibuprofen (ADVIL) 200 MG tablet Take 200 mg by mouth as needed. Reported on 03/18/2016    [provider]  LORazepam (ATIVAN) 0.5 MG tablet Take 1 tab PO 30 min prior to testing Patient not taking: Reported on 06/16/2017 10/21/16   Margaretann Loveless, PA-C  VENTOLIN HFA 108 (90 BASE) MCG/ACT inhaler INHALE 2 PUFFS EVERY 4-6 HOURS AS NEEDED Patient not taking: Reported on 06/16/2017 09/25/15   Lorie Phenix, MD     Allergies Penicillins  Family History  Problem Relation Age of Onset  . Cancer Mother  breast  . Diabetes Mother   . Stroke Mother   . CVA Mother   . Cataracts Mother   . Psoriasis Mother   . Alcohol abuse Father   . Heart disease Sister     Social History Social History  Substance Use Topics  . Smoking status: Never Smoker  . Smokeless tobacco: Never Used  . Alcohol use Yes     Comment: 1/2 glass of wine qhs    Review of Systems  Constitutional: No fever/chills Eyes: No visual changes.  ENT: No sore throat. Cardiovascular: As above Respiratory: As  above Gastrointestinal: No abdominal pain.  No nausea, no vomiting.   Genitourinary: Negative for dysuria. Musculoskeletal: Negative for back pain. Skin: Negative for rash. Neurological: Negative for headaches    ____________________________________________   PHYSICAL EXAM:  VITAL SIGNS: ED Triage Vitals [06/16/17 1015]  Enc Vitals Group     BP 140/76     Pulse Rate 63     Resp 20     Temp 98 F (36.7 C)     Temp Source Oral     SpO2 100 %     Weight 61.2 kg (135 lb)     Height      Head Circumference      Peak Flow      Pain Score 7     Pain Loc      Pain Edu?      Excl. in GC?     Constitutional: Alert and oriented. No acute distress. Pleasant and interactive Eyes: Conjunctivae are normal.   Nose: No congestion/rhinnorhea. Mouth/Throat: Mucous membranes are moist.    Cardiovascular: Normal rate, regular rhythm. Grossly normal heart sounds.  Good peripheral circulation. Respiratory: Normal respiratory effort.  No retractions. Lungs CTAB. Gastrointestinal: Soft and nontender. No distention.  No CVA tenderness. Genitourinary: deferred Musculoskeletal: No lower extremity tenderness nor edema.  Warm and well perfused Neurologic:  Normal speech and language. No gross focal neurologic deficits are appreciated.  Skin:  Skin is warm, dry and intact. No rash noted. Psychiatric: Mood and affect are normal. Speech and behavior are normal.  ____________________________________________   LABS (all labs ordered are listed, but only abnormal results are displayed)  Labs Reviewed  COMPREHENSIVE METABOLIC PANEL - Abnormal; Notable for the following:       Result Value   Sodium 146 (*)    Total Protein 6.1 (*)    AST 65 (*)    All other components within normal limits  CBC  TROPONIN I   ____________________________________________  EKG  ED ECG REPORT I, Jene EveryKINNER, Merland Holness, the attending physician, personally viewed and interpreted this ECG.  Date:  06/16/2017  Rhythm: normal sinus rhythm QRS Axis: normal Intervals: normal ST/T Wave abnormalities: normal Narrative Interpretation: unremarkable  ____________________________________________  RADIOLOGY  CT angiography negative for pulmonary embolism, discuss pulmonary nodule and need for follow-up ____________________________________________   PROCEDURES  Procedure(s) performed: No    Critical Care performed: No ____________________________________________   INITIAL IMPRESSION / ASSESSMENT AND PLAN / ED COURSE  Pertinent labs & imaging results that were available during my care of the patient were reviewed by me and considered in my medical decision making (see chart for details).  Patient well-appearing and in no acute distress. EKG is reassuring. Troponin is normal. CT angiography negative for pulmonary embolism. Lab work is overall unremarkable except for a mildly elevated sodium level. I encouraged by mouth hydration and patient will have this rechecked by her PCP early this week. Suspect chest wall  pain as the cause of her pleurisy. Recommend supportive care and outpatient follow-up, return precautions discussed.    ____________________________________________   FINAL CLINICAL IMPRESSION(S) / ED DIAGNOSES  Final diagnoses:  Pleurisy  Intercostal pain      NEW MEDICATIONS STARTED DURING THIS VISIT:  Discharge Medication List as of 06/16/2017  1:07 PM       Note:  This document was prepared using Dragon voice recognition software and may include unintentional dictation errors.    Jene Every, MD 06/16/17 1539

## 2017-06-16 NOTE — ED Notes (Signed)
Patient transported to CT 

## 2017-06-16 NOTE — ED Notes (Signed)
ED Provider at bedside. 

## 2017-06-16 NOTE — ED Notes (Signed)
SOB with exertion X 3 days, right sided chest pain. Tingling sensation to left arm. Color WNL, full ROM.

## 2017-06-16 NOTE — ED Triage Notes (Addendum)
Pt to ed with c/o sob, was seen at [facility] yesterday for same, had blood work, chest x ray and EKG done,  Was told this am to come to ED to be evaluated for PE.  Pt rates lung pain at this time 7/10, worse with deep breath.  Pt also reports dizziness and tingling in left arm.

## 2017-06-16 NOTE — ED Notes (Signed)
Pt alert and oriented X4, active, cooperative, pt in NAD. RR even and unlabored, color WNL.  Pt informed to return if any life threatening symptoms occur.   

## 2017-06-19 ENCOUNTER — Telehealth: Payer: Self-pay

## 2017-06-19 ENCOUNTER — Telehealth: Payer: Self-pay | Admitting: Physician Assistant

## 2017-06-19 NOTE — Telephone Encounter (Signed)
Lab ordered.  Thanks,  -Janisa Labus

## 2017-06-19 NOTE — Telephone Encounter (Signed)
-----   Message from Margaretann LovelessJennifer M Burnette, [place]PA-C sent at 06/15/2017  4:54 PM EDT ----- CXR is normal

## 2017-06-19 NOTE — Telephone Encounter (Signed)
Ok to order BMP to recheck please. Thanks.

## 2017-06-19 NOTE — Telephone Encounter (Signed)
Patient advised as below.  

## 2017-06-19 NOTE — Telephone Encounter (Signed)
Pt's husband called wanting to know about her having a sodium level being done.  They had suggested it from the hospital.  His call back I [phone]  Thanks Barth Kirksteri

## 2017-09-19 ENCOUNTER — Other Ambulatory Visit: Payer: Self-pay | Admitting: Physician Assistant

## 2017-09-27 ENCOUNTER — Ambulatory Visit (INDEPENDENT_AMBULATORY_CARE_PROVIDER_SITE_OTHER): Payer: Medicare Other | Admitting: Physician Assistant

## 2017-09-27 ENCOUNTER — Encounter: Payer: Self-pay | Admitting: Physician Assistant

## 2017-09-27 VITALS — BP 146/90 | HR 56 | Temp 97.6°F | Resp 16 | Ht 64.0 in | Wt 128.8 lb

## 2017-09-27 DIAGNOSIS — Z0000 Encounter for general adult medical examination without abnormal findings: Secondary | ICD-10-CM | POA: Diagnosis not present

## 2017-09-27 DIAGNOSIS — R7309 Other abnormal glucose: Secondary | ICD-10-CM | POA: Diagnosis not present

## 2017-09-27 DIAGNOSIS — E039 Hypothyroidism, unspecified: Secondary | ICD-10-CM | POA: Diagnosis not present

## 2017-09-27 DIAGNOSIS — D509 Iron deficiency anemia, unspecified: Secondary | ICD-10-CM

## 2017-09-27 DIAGNOSIS — Z1159 Encounter for screening for other viral diseases: Secondary | ICD-10-CM | POA: Diagnosis not present

## 2017-09-27 DIAGNOSIS — E038 Other specified hypothyroidism: Secondary | ICD-10-CM

## 2017-09-27 DIAGNOSIS — I10 Essential (primary) hypertension: Secondary | ICD-10-CM | POA: Diagnosis not present

## 2017-09-27 DIAGNOSIS — Z1211 Encounter for screening for malignant neoplasm of colon: Secondary | ICD-10-CM

## 2017-09-27 DIAGNOSIS — Z1239 Encounter for other screening for malignant neoplasm of breast: Secondary | ICD-10-CM

## 2017-09-27 DIAGNOSIS — Z780 Asymptomatic menopausal state: Secondary | ICD-10-CM

## 2017-09-27 DIAGNOSIS — E7800 Pure hypercholesterolemia, unspecified: Secondary | ICD-10-CM | POA: Diagnosis not present

## 2017-09-27 DIAGNOSIS — Z1231 Encounter for screening mammogram for malignant neoplasm of breast: Secondary | ICD-10-CM

## 2017-09-27 NOTE — Patient Instructions (Signed)
Health Maintenance for Postmenopausal Women Menopause is a normal process in which your reproductive ability comes to an end. This process happens gradually over a span of months to years, usually between the ages of 73 and 9. Menopause is complete when you have missed 12 consecutive menstrual periods. It is important to talk with your health care provider about some of the most common conditions that affect postmenopausal women, such as heart disease, cancer, and bone loss (osteoporosis). Adopting a healthy lifestyle and getting preventive care can help to promote your health and wellness. Those actions can also lower your chances of developing some of these common conditions. What should I know about menopause? During menopause, you may experience a number of symptoms, such as:  Moderate-to-severe hot flashes.  Night sweats.  Decrease in sex drive.  Mood swings.  Headaches.  Tiredness.  Irritability.  Memory problems.  Insomnia.  Choosing to treat or not to treat menopausal changes is an individual decision that you make with your health care provider. What should I know about hormone replacement therapy and supplements? Hormone therapy products are effective for treating symptoms that are associated with menopause, such as hot flashes and night sweats. Hormone replacement carries certain risks, especially as you become older. If you are thinking about using estrogen or estrogen with progestin treatments, discuss the benefits and risks with your health care provider. What should I know about heart disease and stroke? Heart disease, heart attack, and stroke become more likely as you age. This may be due, in part, to the hormonal changes that your body experiences during menopause. These can affect how your body processes dietary fats, triglycerides, and cholesterol. Heart attack and stroke are both medical emergencies. There are many things that you can do to help prevent heart disease  and stroke:  Have your blood pressure checked at least every 1-2 years. High blood pressure causes heart disease and increases the risk of stroke.  If you are 73-22 years old, ask your health care provider if you should take aspirin to prevent a heart attack or a stroke.  Do not use any tobacco products, including cigarettes, chewing tobacco, or electronic cigarettes. If you need help quitting, ask your health care provider.  It is important to eat a healthy diet and maintain a healthy weight. ? Be sure to include plenty of vegetables, fruits, low-fat dairy products, and lean protein. ? Avoid eating foods that are high in solid fats, added sugars, or salt (sodium).  Get regular exercise. This is one of the most important things that you can do for your health. ? Try to exercise for at least 150 minutes each week. The type of exercise that you do should increase your heart rate and make you sweat. This is known as moderate-intensity exercise. ? Try to do strengthening exercises at least twice each week. Do these in addition to the moderate-intensity exercise.  Know your numbers.Ask your health care provider to check your cholesterol and your blood glucose. Continue to have your blood tested as directed by your health care provider.  What should I know about cancer screening? There are several types of cancer. Take the following steps to reduce your risk and to catch any cancer development as early as possible. Breast Cancer  Practice breast self-awareness. ? This means understanding how your breasts normally appear and feel. ? It also means doing regular breast self-exams. Let your health care provider know about any changes, no matter how small.  If you are 73  or older, have a clinician do a breast exam (clinical breast exam or CBE) every year. Depending on your age, family history, and medical history, it may be recommended that you also have a yearly breast X-ray (mammogram).  If you  have a family history of breast cancer, talk with your health care provider about genetic screening.  If you are at high risk for breast cancer, talk with your health care provider about having an MRI and a mammogram every year.  Breast cancer (BRCA) gene test is recommended for women who have family members with BRCA-related cancers. Results of the assessment will determine the need for genetic counseling and BRCA1 and for BRCA2 testing. BRCA-related cancers include these types: ? Breast. This occurs in males or females. ? Ovarian. ? Tubal. This may also be called fallopian tube cancer. ? Cancer of the abdominal or pelvic lining (peritoneal cancer). ? Prostate. ? Pancreatic.  Cervical, Uterine, and Ovarian Cancer Your health care provider may recommend that you be screened regularly for cancer of the pelvic organs. These include your ovaries, uterus, and vagina. This screening involves a pelvic exam, which includes checking for microscopic changes to the surface of your cervix (Pap test).  For women ages 21-65, health care providers may recommend a pelvic exam and a Pap test every three years. For women ages 73-65, they may recommend the Pap test and pelvic exam, combined with testing for human papilloma virus (HPV), every five years. Some types of HPV increase your risk of cervical cancer. Testing for HPV may also be done on women of any age who have unclear Pap test results.  Other health care providers may not recommend any screening for nonpregnant women who are considered low risk for pelvic cancer and have no symptoms. Ask your health care provider if a screening pelvic exam is right for you.  If you have had past treatment for cervical cancer or a condition that could lead to cancer, you need Pap tests and screening for cancer for at least 20 years after your treatment. If Pap tests have been discontinued for you, your risk factors (such as having a new sexual partner) need to be  reassessed to determine if you should start having screenings again. Some women have medical problems that increase the chance of getting cervical cancer. In these cases, your health care provider may recommend that you have screening and Pap tests more often.  If you have a family history of uterine cancer or ovarian cancer, talk with your health care provider about genetic screening.  If you have vaginal bleeding after reaching menopause, tell your health care provider.  There are currently no reliable tests available to screen for ovarian cancer.  Lung Cancer Lung cancer screening is recommended for adults 69-62 years old who are at high risk for lung cancer because of a history of smoking. A yearly low-dose CT scan of the lungs is recommended if you:  Currently smoke.  Have a history of at least 30 pack-years of smoking and you currently smoke or have quit within the past 15 years. A pack-year is smoking an average of one pack of cigarettes per day for one year.  Yearly screening should:  Continue until it has been 15 years since you quit.  Stop if you develop a health problem that would prevent you from having lung cancer treatment.  Colorectal Cancer  This type of cancer can be detected and can often be prevented.  Routine colorectal cancer screening usually begins at  age 42 and continues through age 45.  If you have risk factors for colon cancer, your health care provider may recommend that you be screened at an earlier age.  If you have a family history of colorectal cancer, talk with your health care provider about genetic screening.  Your health care provider may also recommend using home test kits to check for hidden blood in your stool.  A small camera at the end of a tube can be used to examine your colon directly (sigmoidoscopy or colonoscopy). This is done to check for the earliest forms of colorectal cancer.  Direct examination of the colon should be repeated every  5-10 years until age 71. However, if early forms of precancerous polyps or small growths are found or if you have a family history or genetic risk for colorectal cancer, you may need to be screened more often.  Skin Cancer  Check your skin from head to toe regularly.  Monitor any moles. Be sure to tell your health care provider: ? About any new moles or changes in moles, especially if there is a change in a mole's shape or color. ? If you have a mole that is larger than the size of a pencil eraser.  If any of your family members has a history of skin cancer, especially at a young age, talk with your health care provider about genetic screening.  Always use sunscreen. Apply sunscreen liberally and repeatedly throughout the day.  Whenever you are outside, protect yourself by wearing long sleeves, pants, a wide-brimmed hat, and sunglasses.  What should I know about osteoporosis? Osteoporosis is a condition in which bone destruction happens more quickly than new bone creation. After menopause, you may be at an increased risk for osteoporosis. To help prevent osteoporosis or the bone fractures that can happen because of osteoporosis, the following is recommended:  If you are 46-71 years old, get at least 1,000 mg of calcium and at least 600 mg of vitamin D per day.  If you are older than age 55 but younger than age 65, get at least 1,200 mg of calcium and at least 600 mg of vitamin D per day.  If you are older than age 54, get at least 1,200 mg of calcium and at least 800 mg of vitamin D per day.  Smoking and excessive alcohol intake increase the risk of osteoporosis. Eat foods that are rich in calcium and vitamin D, and do weight-bearing exercises several times each week as directed by your health care provider. What should I know about how menopause affects my mental health? Depression may occur at any age, but it is more common as you become older. Common symptoms of depression  include:  Low or sad mood.  Changes in sleep patterns.  Changes in appetite or eating patterns.  Feeling an overall lack of motivation or enjoyment of activities that you previously enjoyed.  Frequent crying spells.  Talk with your health care provider if you think that you are experiencing depression. What should I know about immunizations? It is important that you get and maintain your immunizations. These include:  Tetanus, diphtheria, and pertussis (Tdap) booster vaccine.  Influenza every year before the flu season begins.  Pneumonia vaccine.  Shingles vaccine.  Your health care provider may also recommend other immunizations. This information is not intended to replace advice given to you by your health care provider. Make sure you discuss any questions you have with your health care provider. Document Released: 12/16/2005  Document Revised: 05/13/2016 Document Reviewed: 07/28/2015 [employer] Interactive Patient Education  2018 [employer]  

## 2017-09-27 NOTE — Progress Notes (Signed)
[name], Female    DOB: 08/22/1946, 71 y.o.   MRN: [id number] Visit Date: 09/27/2017  Today's Provider: Margaretann LovelessJennifer M Burnette, PA-C   Chief Complaint  Patient presents with  . Medicare Wellness   Subjective:    Annual wellness visit Michele LinesMaureen A Bluett is a [age] y.o. female. She feels well. She reports exercising active with daily activities. She reports she is sleeping well.  08/25/16 AWE -----------------------------------------------------------   Review of Systems  Constitutional: Negative.   HENT: Positive for rhinorrhea.   Eyes: Negative.   Respiratory: Negative.   Cardiovascular: Negative.   Gastrointestinal: Negative.   Endocrine: Positive for cold intolerance.  Genitourinary: Negative.   Musculoskeletal: Negative.   Skin: Negative.   Allergic/Immunologic: Negative.   Neurological: Negative.   Hematological: Negative.   Psychiatric/Behavioral: Negative.     Social History   Socioeconomic History  . Marital status: Married    Spouse name: Not on file  . Number of children: Not on file  . Years of education: Not on file  . Highest education level: Not on file  Social Needs  . Financial resource strain: Not on file  . Food insecurity - worry: Not on file  . Food insecurity - inability: Not on file  . Transportation needs - medical: Not on file  . Transportation needs - non-medical: Not on file  Occupational History  . Not on file  Tobacco Use  . Smoking status: Never Smoker  . Smokeless tobacco: Never Used  Substance and Sexual Activity  . Alcohol use: Yes    Comment: 1/2 glass of wine qhs  . Drug use: No  . Sexual activity: Not on file  Other Topics Concern  . Not on file  Social History Narrative  . Not on file    Past Medical History:  Diagnosis Date  . Asthma   . Hyperlipidemia   . Hypertension   . Stroke [facility](HCC)      Patient Active Problem List   Diagnosis Date Noted  . Abnormal LFTs (liver function tests)  03/09/2016  . Fatigue 03/09/2016  . Abnormal weight loss 03/09/2016  . Palpitations 01/19/2016  . Shortness of breath 01/19/2016  . 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 Ischemic stroke (HCC) 12/14/2015  . Subclinical hypothyroidism 07/17/2015  . Calcium blood increased 07/17/2015  . Belching 07/17/2015  . Airway hyperreactivity 07/17/2015  . Allergic rhinitis 07/17/2015  . Abnormal blood sugar 02/18/2010  . Herpes zona 06/15/2009  . Family history of breast cancer 12/22/2007  . Benign hypertension 12/21/2007  . HLD (hyperlipidemia) 04/17/2007    Past Surgical History:  Procedure Laterality Date  . APPENDECTOMY    . TONSILLECTOMY      Her family history includes Alcohol abuse in her father; CVA in her mother; Cancer in her mother; Cataracts in her mother; Diabetes in her mother; Heart disease in her sister; Psoriasis in her mother; Stroke in her mother.      Current Outpatient Medications:  .  acetaminophen (TYLENOL) 500 MG tablet, Take 500 mg by mouth every 6 (six) hours as needed for moderate pain or headache. Reported on 03/18/2016, Disp: , Rfl:  .  aspirin 81 MG tablet, Take 81 mg by mouth daily. , Disp: , Rfl:  .  atorvastatin (LIPITOR) 80 MG tablet, TAKE 1 TABLET BY MOUTH DAILY AT 6PM, Disp: 90 tablet, Rfl: 3 .  ferrous sulfate 325 (65 FE) MG tablet, Take 1 tablet (325 mg total) by mouth  2 (two) times daily with a meal., Disp: 60 tablet, Rfl: 3 .  ibuprofen (ADVIL) 200 MG tablet, Take 200 mg by mouth as needed. Reported on 03/18/2016, Disp: , Rfl:  .  MULTIPLE VITAMINS-MINERALS PO, Take 1 tablet by mouth daily., Disp: , Rfl:  .  loratadine (CLARITIN) 10 MG tablet, Take 1 tablet by mouth daily as needed. Reported on 03/18/2016, Disp: , Rfl:  .  LORazepam (ATIVAN) 0.5 MG tablet, Take 1 tab PO 30 min prior to testing (Patient not taking: Reported on 06/16/2017), Disp: 1 tablet, Rfl: 0 .  meclizine (ANTIVERT) 25 MG tablet, Take 1 tablet (25 mg total) by mouth 3  (three) times daily as needed for dizziness. (Patient not taking: Reported on 09/27/2017), Disp: 30 tablet, Rfl: 0 .  VENTOLIN HFA 108 (90 BASE) MCG/ACT inhaler, INHALE 2 PUFFS EVERY 4-6 HOURS AS NEEDED (Patient not taking: Reported on 06/16/2017), Disp: 36 g, Rfl: 2  Patient Care Team: Margaretann LovelessBurnette, Jennifer M, PA-C as PCP - General (Family Medicine)     Objective:   Vitals: BP (!) 146/90 (BP Location: Left Arm, Patient Position: Sitting, Cuff Size: Normal)   Pulse (!) 56   Temp 97.6 F (36.4 C) (Oral)   Resp 16   Ht 5\' 4"  (1.626 m)   Wt 128 lb 12.8 oz (58.4 kg)   SpO2 99%   BMI 22.11 kg/m   Physical Exam  Constitutional: She is oriented to person, place, and time. She appears well-developed and well-nourished. No distress.  HENT:  Head: Normocephalic and atraumatic.  Right Ear: Hearing, tympanic membrane, external ear and ear canal normal.  Left Ear: Hearing, tympanic membrane, external ear and ear canal normal.  Nose: Nose normal.  Mouth/Throat: Uvula is midline, oropharynx is clear and moist and mucous membranes are normal. No oropharyngeal exudate.  Eyes: Conjunctivae and EOM are normal. Pupils are equal, round, and reactive to light. Right eye exhibits no discharge. Left eye exhibits no discharge. No scleral icterus.  Neck: Normal range of motion. Neck supple. No JVD present. Carotid bruit is not present. No tracheal deviation present. No thyromegaly present.  Cardiovascular: Normal rate, regular rhythm, normal heart sounds and intact distal pulses. Exam reveals no gallop and no friction rub.  No murmur heard. Pulmonary/Chest: Effort normal and breath sounds normal. No respiratory distress. She has no wheezes. She has no rales. She exhibits no tenderness. Right breast exhibits no inverted nipple, no mass, no nipple discharge, no skin change and no tenderness. Left breast exhibits no inverted nipple, no mass, no nipple discharge, no skin change and no tenderness. Breasts are  symmetrical.  Abdominal: Soft. Bowel sounds are normal. She exhibits no distension and no mass. There is no tenderness. There is no rebound and no guarding.  Musculoskeletal: Normal range of motion. She exhibits no edema or tenderness.  Lymphadenopathy:    She has no cervical adenopathy.  Neurological: She is alert and oriented to person, place, and time.  Skin: Skin is warm and dry. No rash noted. She is not diaphoretic.  Psychiatric: She has a normal mood and affect. Her behavior is normal. Judgment and thought content normal.  Vitals reviewed.   Activities of Daily Living In your present state of health, do you have any difficulty performing the following activities: 09/27/2017  Hearing? N  Vision? N  Difficulty concentrating or making decisions? N  Walking or climbing stairs? N  Dressing or bathing? N  Doing errands, shopping? N  Some recent data might be hidden  Fall Risk Assessment Fall Risk  09/27/2017 08/25/2016 02/17/2016  Falls in the past year? No No No     Depression Screen PHQ 2/9 Scores 09/27/2017 08/25/2016 02/17/2016  PHQ - 2 Score 0 0 0  PHQ- 9 Score 0 - -    Cognitive Testing - 6-CIT  Correct? Score   What year is it? yes 0 0 or 4  What month is it? yes 0 0 or 3  Memorize:    Floyde Parkins,  40,  [address]  [place],      What time is it? (within 1 hour) yes 0 0 or 3  Count backwards from 20 yes 0 0, 2, or 4  Name the months of the year yes 0 0, 2, or 4  Repeat name & address above no 2 0, 2, 4, 6, 8, or 10       TOTAL SCORE  2/28   Interpretation:  Normal  Normal (0-7) Abnormal (8-28)       Assessment & Plan:     Annual Wellness Visit  Reviewed patient's Family Medical History Reviewed and updated list of patient's medical providers Assessment of cognitive impairment was done Assessed patient's functional ability Established a written schedule for health screening services Health Risk Assessent Completed and Reviewed  Exercise Activities  and Dietary recommendations Goals    None      Immunization History  Administered Date(s) Administered  . Pneumococcal Polysaccharide-23 02/18/2010  . Td 12/05/2011  . Tdap 12/05/2011  . Zoster 08/24/2010    Health Maintenance  Topic Date Due  . Hepatitis C Screening  1946/02/24  . PNA vac Low Risk Adult (2 of 2 - PCV13) 02/19/2011  . MAMMOGRAM  02/05/2018 (Originally 10/26/1994)  . INFLUENZA VACCINE  02/20/2018 (Originally 06/07/2017)  . DEXA SCAN  09/27/2018 (Originally 10/26/2009)  . COLONOSCOPY  09/27/2018 (Originally 10/26/1994)  . TETANUS/TDAP  12/04/2021     Discussed health benefits of physical activity, and encouraged her to engage in regular exercise appropriate for her age and condition.    1. Medicare annual wellness visit, subsequent Normal exam. Patient refuses flu, pneumonia, tetanus vaccines. Refuses mammograms and colonoscopy.   2. Screening for breast cancer Refuses mammogram. She does perform self breat exams.   3. Colon cancer screening Refused colonoscopy and cologuard. Has no symptoms and no family history.   4. Postmenopausal Stable.   5. Benign hypertension Slightly elevated today. Patient not agreeable to BP medications at this time. Has had low BP with medications. Will check labs as below and f/u pending results. - CBC with Differential/Platelet - Comprehensive metabolic panel - Lipid panel - Hemoglobin A1c  6. Subclinical hypothyroidism Asymptomatic. Will check labs as below and f/u pending results. - TSH  7. Pure hypercholesterolemia Stable. Atorvastatin 80mg . Will check labs as below and f/u pending results. - CBC with Differential/Platelet - Comprehensive metabolic panel - Lipid panel - Hemoglobin A1c  8. Abnormal blood sugar H/O this. Diet controlled. Will check labs as below and f/u pending results. - Comprehensive metabolic panel - Lipid panel - Hemoglobin A1c  9. Iron deficiency anemia, unspecified iron deficiency anemia  type H/O this. Will check labs as below and f/u pending results. - CBC with Differential/Platelet - Comprehensive metabolic panel  10. Need for hepatitis C screening test - Hepatitis C antibody  ------------------------------------------------------------------------------------------------------------    Margaretann Loveless, PA-C  [facility]

## 2017-09-28 LAB — LIPID PANEL
CHOL/HDL RATIO: 2.7 (calc) (ref <5.0)
CHOLESTEROL: 140 mg/dL (ref <200)
HDL: 52 mg/dL (ref >50)
LDL CHOLESTEROL (CALC): 72 mg/dL
NON-HDL CHOLESTEROL (CALC): 88 mg/dL (ref <130)
Triglycerides: 76 mg/dL (ref <150)

## 2017-09-28 LAB — COMPREHENSIVE METABOLIC PANEL
AG Ratio: 1.8 (calc) (ref 1.0–2.5)
ALBUMIN MSPROF: 4 g/dL (ref 3.6–5.1)
ALT: 33 U/L — ABNORMAL HIGH (ref 6–29)
AST: 51 U/L — ABNORMAL HIGH (ref 10–35)
Alkaline phosphatase (APISO): 109 U/L (ref 33–130)
BUN: 16 mg/dL (ref 7–25)
CHLORIDE: 107 mmol/L (ref 98–110)
CO2: 29 mmol/L (ref 20–32)
CREATININE: 0.79 mg/dL (ref 0.60–0.93)
Calcium: 10 mg/dL (ref 8.6–10.4)
GLOBULIN: 2.2 g/dL (ref 1.9–3.7)
GLUCOSE: 90 mg/dL (ref 65–99)
POTASSIUM: 3.7 mmol/L (ref 3.5–5.3)
Sodium: 143 mmol/L (ref 135–146)
Total Bilirubin: 0.8 mg/dL (ref 0.2–1.2)
Total Protein: 6.2 g/dL (ref 6.1–8.1)

## 2017-09-28 LAB — CBC WITH DIFFERENTIAL/PLATELET
BASOS PCT: 1.3 %
Basophils Absolute: 51 cells/uL (ref 0–200)
EOS PCT: 2.5 %
Eosinophils Absolute: 98 cells/uL (ref 15–500)
HCT: 40.9 % (ref 35.0–45.0)
Hemoglobin: 13.8 g/dL (ref 11.7–15.5)
Lymphs Abs: 815 cells/uL — ABNORMAL LOW (ref 850–3900)
MCH: 31.6 pg (ref 27.0–33.0)
MCHC: 33.7 g/dL (ref 32.0–36.0)
MCV: 93.6 fL (ref 80.0–100.0)
MONOS PCT: 8.9 %
MPV: 10.3 fL (ref 7.5–12.5)
NEUTROS PCT: 66.4 %
Neutro Abs: 2590 cells/uL (ref 1500–7800)
PLATELETS: 171 10*3/uL (ref 140–400)
RBC: 4.37 10*6/uL (ref 3.80–5.10)
RDW: 11.5 % (ref 11.0–15.0)
TOTAL LYMPHOCYTE: 20.9 %
WBC mixed population: 347 cells/uL (ref 200–950)
WBC: 3.9 10*3/uL (ref 3.8–10.8)

## 2017-09-28 LAB — TSH: TSH: 3.95 mIU/L (ref 0.40–4.50)

## 2017-09-28 LAB — HEMOGLOBIN A1C
HEMOGLOBIN A1C: 5.1 %{Hb} (ref <5.7)
MEAN PLASMA GLUCOSE: 100 (calc)
eAG (mmol/L): 5.5 (calc)

## 2017-09-28 LAB — HEPATITIS C ANTIBODY
HEP C AB: NONREACTIVE
SIGNAL TO CUT-OFF: 0.01 (ref <1.00)

## 2017-10-02 ENCOUNTER — Telehealth: Payer: Self-pay

## 2017-10-02 NOTE — Telephone Encounter (Signed)
-----   Message from Margaretann LovelessJennifer M Burnette, PA-C sent at 10/02/2017  8:40 AM EST ----- All labs are within normal limits and stable.  Thanks! -JB

## 2017-10-02 NOTE — Telephone Encounter (Signed)
Patient advised as below.  

## 2018-02-16 ENCOUNTER — Ambulatory Visit
Admission: RE | Admit: 2018-02-16 | Discharge: 2018-02-16 | Disposition: A | Discharge status: Home / Self Care | Payer: Medicare Other | Source: Ambulatory Visit | Attending: Family Medicine | Admitting: Family Medicine

## 2018-02-16 ENCOUNTER — Encounter: Payer: Self-pay | Admitting: Family Medicine

## 2018-02-16 ENCOUNTER — Ambulatory Visit (INDEPENDENT_AMBULATORY_CARE_PROVIDER_SITE_OTHER): Payer: Medicare Other | Admitting: Family Medicine

## 2018-02-16 VITALS — BP 124/81 | HR 62 | Temp 97.5°F | Resp 18 | Wt 125.0 lb

## 2018-02-16 DIAGNOSIS — R634 Abnormal weight loss: Secondary | ICD-10-CM

## 2018-02-16 DIAGNOSIS — Q279 Congenital malformation of peripheral vascular system, unspecified: Secondary | ICD-10-CM | POA: Diagnosis not present

## 2018-02-16 DIAGNOSIS — I878 Other specified disorders of veins: Secondary | ICD-10-CM | POA: Diagnosis not present

## 2018-02-16 DIAGNOSIS — R2231 Localized swelling, mass and lump, right upper limb: Secondary | ICD-10-CM | POA: Insufficient documentation

## 2018-02-16 NOTE — Assessment & Plan Note (Signed)
Ongoing issue Patient has had multiple rounds of normal labs which were reviewed Patient does not have recent cancer screenings - encouraged her to get these done She would like to think about it Will f/u in 2 months and work toward convincing her again

## 2018-02-16 NOTE — Progress Notes (Signed)
Patient: Michele LinesMaureen A Silliman Female    DOB: [birth year]   72 y.o.   MRN: [id number] Visit Date: 02/16/2018  Today's Provider: Shirlee LatchAngela Bacigalupo, MD   Chief Complaint  Patient presents with  . Fatigue   Subjective:    HPI Pt is here today for fatigue and she has a bump on her right arm in a vein. She reports that it does not hurt. Pt and her husband are concerned for a DVT as pt has a history of stroke. Pt reports that she has not eaten today and feels weak. She has not eaten because she did not know what was going to need to be done for this. She also has been losing weight even though she has been eating well. She repors that she feels dizziness and off balance. She denies chest pain, shortness of breath.     Allergies  Allergen Reactions  . Penicillins      Current Outpatient Medications:  .  aspirin 81 MG tablet, Take 81 mg by mouth daily. , Disp: , Rfl:  .  atorvastatin (LIPITOR) 80 MG tablet, TAKE 1 TABLET BY MOUTH DAILY AT 6PM, Disp: 90 tablet, Rfl: 3 .  ferrous sulfate 325 (65 FE) MG tablet, Take 1 tablet (325 mg total) by mouth 2 (two) times daily with a meal., Disp: 60 tablet, Rfl: 3 .  ibuprofen (ADVIL) 200 MG tablet, Take 200 mg by mouth as needed. Reported on 03/18/2016, Disp: , Rfl:  .  MULTIPLE VITAMINS-MINERALS PO, Take 1 tablet by mouth daily., Disp: , Rfl:  .  acetaminophen (TYLENOL) 500 MG tablet, Take 500 mg by mouth every 6 (six) hours as needed for moderate pain or headache. Reported on 03/18/2016, Disp: , Rfl:  .  loratadine (CLARITIN) 10 MG tablet, Take 1 tablet by mouth daily as needed. Reported on 03/18/2016, Disp: , Rfl:  .  LORazepam (ATIVAN) 0.5 MG tablet, Take 1 tab PO 30 min prior to testing (Patient not taking: Reported on 06/16/2017), Disp: 1 tablet, Rfl: 0 .  meclizine (ANTIVERT) 25 MG tablet, Take 1 tablet (25 mg total) by mouth 3 (three) times daily as needed for dizziness. (Patient not taking: Reported on 09/27/2017), Disp: 30 tablet, Rfl: 0 .   VENTOLIN HFA 108 (90 BASE) MCG/ACT inhaler, INHALE 2 PUFFS EVERY 4-6 HOURS AS NEEDED (Patient not taking: Reported on 06/16/2017), Disp: 36 g, Rfl: 2  Review of Systems  Constitutional: Positive for fatigue.  HENT: Negative.   Eyes: Negative.   Respiratory: Negative.   Cardiovascular: Negative.   Gastrointestinal: Negative.   Endocrine: Negative.   Genitourinary: Negative.   Musculoskeletal: Negative.   Skin: Negative.   Neurological: Positive for dizziness, light-headedness and headaches.  Hematological: Negative.   Psychiatric/Behavioral: Negative.     Social History   Tobacco Use  . Smoking status: Never Smoker  . Smokeless tobacco: Never Used  Substance Use Topics  . Alcohol use: Yes    Comment: 1/2 glass of wine qhs   Objective:   BP 124/81 (BP Location: Left Arm, Patient Position: Sitting, Cuff Size: Normal)   Pulse 62   Temp (!) 97.5 F (36.4 C) (Oral)   Resp 18   Wt 125 lb (56.7 kg)   SpO2 95%   BMI 21.46 kg/m  Vitals:   02/16/18 1324  BP: 124/81  Pulse: 62  Resp: 18  Temp: (!) 97.5 F (36.4 C)  TempSrc: Oral  SpO2: 95%  Weight: 125 lb (56.7 kg)  Physical Exam  Constitutional: She is oriented to person, place, and time. She appears well-developed and well-nourished. No distress.  HENT:  Head: Normocephalic and atraumatic.  Mouth/Throat: Oropharynx is clear and moist. No oropharyngeal exudate.  Eyes: Pupils are equal, round, and reactive to light. Conjunctivae are normal. No scleral icterus.  Neck: Neck supple. No thyromegaly present.  Cardiovascular: Normal rate, regular rhythm, normal heart sounds and intact distal pulses.  No murmur heard. Pulmonary/Chest: Effort normal and breath sounds normal. No respiratory distress. She has no wheezes. She has no rales.  Musculoskeletal: She exhibits no edema or deformity.  Prominent vasculature across bilateral arms.  Palpable vein in R AC that is quite TTP. No surrounding erythema  Lymphadenopathy:     She has no cervical adenopathy.  Neurological: She is alert and oriented to person, place, and time.  Skin: Skin is warm and dry. Capillary refill takes less than 2 seconds. No rash noted.  Psychiatric: She has a normal mood and affect. Her behavior is normal.  Vitals reviewed.   Review of previous visits reveals loss of 50 lbs since late 2017.     Assessment & Plan:   Problem List Items Addressed This Visit      Other   Unintentional weight loss    Ongoing issue Patient has had multiple rounds of normal labs which were reviewed Patient does not have recent cancer screenings - encouraged her to get these done She would like to think about it Will f/u in 2 months and work toward convincing her again       Other Visit Diagnoses    Venous anomaly    -  Primary   Relevant Orders   VAS [place] UPPER EXTREMITY VENOUS DUPLEX   US Venous Img Upper Uni Right    1. Venous anomaly - imaging as above to r/o DVT - seems to be just a prominent vein that is palpable - likely that significant weight loss is making veins more prominent   Return in about 2 months (around 04/18/2018) for weight loss, fatigue follow-up.   The entirety of the information documented in the History of Present Illness, Review of Systems and Physical Exam were personally obtained by me. Portions of this information were initially documented by [place], CMA and reviewed by me for thoroughness and accuracy.    Erasmo Downer, MD, MPH [facility] 02/16/2018 2:22 PM

## 2018-02-19 ENCOUNTER — Telehealth: Payer: Self-pay | Admitting: Family Medicine

## 2018-02-19 NOTE — Telephone Encounter (Signed)
Pt and her husband advised. Pt states the "round bubble" seemed to disappear after her US due to palpation of the US tech. States this has reappeared after 1-2 days. She just wanted PCP to be aware of this as an [place].

## 2018-02-19 NOTE — Telephone Encounter (Signed)
Oh. When vascular US lab called with results, they seemed to have told the patient.  It was normal.  No DVT.  Seems that veins are just prominent due to ongoing weight loss.  Erasmo DownerBacigalupo, Johari Bennetts M, MD, MPH [facility] 02/19/2018 4:45 PM

## 2018-02-19 NOTE — Telephone Encounter (Signed)
Resulted in chart. Please review and advise?

## 2018-02-19 NOTE — Telephone Encounter (Signed)
Pt's calling to get results from US on Friday, please a with results. Thanks CC

## 2018-02-20 NOTE — Telephone Encounter (Signed)
Noted.  Erasmo DownerBacigalupo, Angela M, MD, MPH [facility] 02/20/2018 8:18 AM

## 2018-04-18 ENCOUNTER — Encounter: Payer: Self-pay | Admitting: Family Medicine

## 2018-04-18 ENCOUNTER — Ambulatory Visit (INDEPENDENT_AMBULATORY_CARE_PROVIDER_SITE_OTHER): Payer: Medicare Other | Admitting: Family Medicine

## 2018-04-18 VITALS — BP 112/72 | HR 67 | Temp 98.2°F | Resp 16 | Wt 125.0 lb

## 2018-04-18 DIAGNOSIS — R918 Other nonspecific abnormal finding of lung field: Secondary | ICD-10-CM

## 2018-04-18 DIAGNOSIS — Z1231 Encounter for screening mammogram for malignant neoplasm of breast: Secondary | ICD-10-CM

## 2018-04-18 DIAGNOSIS — R634 Abnormal weight loss: Secondary | ICD-10-CM | POA: Diagnosis not present

## 2018-04-18 DIAGNOSIS — Z1239 Encounter for other screening for malignant neoplasm of breast: Secondary | ICD-10-CM

## 2018-04-18 DIAGNOSIS — R911 Solitary pulmonary nodule: Secondary | ICD-10-CM | POA: Diagnosis not present

## 2018-04-18 DIAGNOSIS — Z23 Encounter for immunization: Secondary | ICD-10-CM

## 2018-04-18 NOTE — Progress Notes (Signed)
[name] Female    DOB: Oct 15, 1946   72 y.o.   MRN: [id number] Visit Date: 04/18/2018  Today's Provider: Shirlee Latch, MD   I, Joslyn Hy, CMA, am acting as scribe for Shirlee Latch, MD.  Chief Complaint  Patient presents with  . Weight Loss    Follow Up   Subjective:    HPI     Follow up for Unintentional Weight Loss  The patient was last seen for this 2 months ago. Changes made at last visit include reviewing labs, and encouraging pt to get cancer screenings..  She feels that condition is Improved. She states she is feeling "very good and I feel balanced".  States that she is feeling much better and relaxed compared to previously.  Appetite is good, which has changed. Previously having dizziness, fatigue, malaise.  She thought this was related to weight loss.  Crocheting and knitting for relaxing and centering self and seeing family seems to have helped.  She thinks she is now eating a healthier diet.  Mother had breast cancer.  Patient has never had breast or colon cancer screening.   ------------------------------------------------------------------------------------    Allergies  Allergen Reactions  . Penicillins      Current Outpatient Medications:  .  acetaminophen (TYLENOL) 500 MG tablet, Take 500 mg by mouth every 6 (six) hours as needed for moderate pain or headache. Reported on 03/18/2016, Disp: , Rfl:  .  aspirin 81 MG tablet, Take 81 mg by mouth daily. , Disp: , Rfl:  .  atorvastatin (LIPITOR) 80 MG tablet, TAKE 1 TABLET BY MOUTH DAILY AT 6PM, Disp: 90 tablet, Rfl: 3 .  ferrous sulfate 325 (65 FE) MG tablet, Take 1 tablet (325 mg total) by mouth 2 (two) times daily with a meal., Disp: 60 tablet, Rfl: 3 .  ibuprofen (ADVIL) 200 MG tablet, Take 200 mg by mouth as needed. Reported on 03/18/2016, Disp: , Rfl:  .  MULTIPLE VITAMINS-MINERALS PO, Take 1 tablet by mouth daily., Disp: , Rfl:  .  LORazepam (ATIVAN) 0.5 MG tablet,  Take 1 tab PO 30 min prior to testing (Patient not taking: Reported on 06/16/2017), Disp: 1 tablet, Rfl: 0  Review of Systems  Constitutional: Negative for activity change, appetite change, chills, diaphoresis, fatigue, fever and unexpected weight change.  HENT: Negative.   Respiratory: Negative.  Negative for shortness of breath.   Cardiovascular: Negative for chest pain, palpitations and leg swelling.  Gastrointestinal: Negative.   Genitourinary: Negative.   Musculoskeletal: Negative.   Skin: Negative.   Neurological: Negative.   Hematological: Negative.   Psychiatric/Behavioral: Negative.     Social History   Tobacco Use  . Smoking status: Never Smoker  . Smokeless tobacco: Never Used  Substance Use Topics  . Alcohol use: Yes    Comment: 1/2 glass of wine qhs   Objective:   BP 112/72 (BP Location: Left Arm, Patient Position: Sitting, Cuff Size: Normal)   Pulse 67   Temp 98.2 F (36.8 C) (Oral)   Resp 16   Wt 125 lb (56.7 kg)   SpO2 99%   BMI 21.46 kg/m  Vitals:   04/18/18 1325  BP: 112/72  Pulse: 67  Resp: 16  Temp: 98.2 F (36.8 C)  TempSrc: Oral  SpO2: 99%  Weight: 125 lb (56.7 kg)     Physical Exam  Constitutional: She is oriented to person, place, and time. She appears well-developed and well-nourished. No distress.  HENT:  Head:  Normocephalic and atraumatic.  Eyes: Conjunctivae are normal. No scleral icterus.  Neck: Neck supple. No thyromegaly present.  Cardiovascular: Normal rate, regular rhythm, normal heart sounds and intact distal pulses.  No murmur heard. Pulmonary/Chest: Effort normal and breath sounds normal. No respiratory distress. She has no wheezes. She has no rales.  Abdominal: Soft. Bowel sounds are normal. She exhibits no distension and no mass. There is no guarding.  Musculoskeletal: She exhibits no edema.  Lymphadenopathy:    She has no cervical adenopathy.  Neurological: She is alert and oriented to person, place, and time.  Skin:  Skin is warm and dry. Capillary refill takes less than 2 seconds. No rash noted.  Psychiatric: She has a normal mood and affect. Her behavior is normal.  Vitals reviewed.   CTA Chest 06/2017 reviewed: no evidence of PE, hiatal hernia, Aortic atherosclerosis.  7mm Pulmonary nodule in RML.  Recommended repeat CT chest in 6-12 months.    Assessment & Plan:   Problem List Items Addressed This Visit      Other   Unintentional weight loss - Primary    Patient with ~50lb weight loss over 2 years and 10lb in last year Trend has slowed somewhat Patient is asymptomatic She has had multiple rounds of normal labs - reviewed Again discussed concern for occult cancer process She agrees to get mammogram and f/u lung nodule with CT chest She wants to wait longer to consider colon cancer screening F/u in 3 months and work on getting colonoscopy again - could consider CT abd/pelvis also      Relevant Orders   MS DIGITAL SCREENING TOMO BILATERAL   CT Chest Wo Contrast    Other Visit Diagnoses    Abnormal findings on diagnostic imaging of lung       Relevant Orders   CT Chest Wo Contrast   Breast cancer screening       Relevant Orders   MS DIGITAL SCREENING TOMO BILATERAL   Lung nodule       Relevant Orders   CT Chest Wo Contrast   Need for pneumococcal vaccination       Relevant Orders   Pneumococcal conjugate vaccine 13-valent IM (Completed)       Return in about 3 months (around 07/19/2018) for Weight loss f/u.   The entirety of the information documented in the History of Present Illness, Review of Systems and Physical Exam were personally obtained by me. Portions of this information were initially documented by Irving BurtonEmily Ratchford, CMA and reviewed by me for thoroughness and accuracy.    Erasmo DownerBacigalupo, Angela M, MD, MPH [facility] 04/18/2018 2:20 PM

## 2018-04-18 NOTE — Assessment & Plan Note (Signed)
Patient with ~50lb weight loss over 2 years and 10lb in last year Trend has slowed somewhat Patient is asymptomatic She has had multiple rounds of normal labs - reviewed Again discussed concern for occult cancer process She agrees to get mammogram and f/u lung nodule with CT chest She wants to wait longer to consider colon cancer screening F/u in 3 months and work on getting colonoscopy again - could consider CT abd/pelvis also

## 2018-04-18 NOTE — Patient Instructions (Signed)
Health Maintenance for Postmenopausal Women Menopause is a normal process in which your reproductive ability comes to an end. This process happens gradually over a span of months to years, usually between the ages of 22 and 9. Menopause is complete when you have missed 12 consecutive menstrual periods. It is important to talk with your health care provider about some of the most common conditions that affect postmenopausal women, such as heart disease, cancer, and bone loss (osteoporosis). Adopting a healthy lifestyle and getting preventive care can help to promote your health and wellness. Those actions can also lower your chances of developing some of these common conditions. What should I know about menopause? During menopause, you may experience a number of symptoms, such as:  Moderate-to-severe hot flashes.  Night sweats.  Decrease in sex drive.  Mood swings.  Headaches.  Tiredness.  Irritability.  Memory problems.  Insomnia.  Choosing to treat or not to treat menopausal changes is an individual decision that you make with your health care provider. What should I know about hormone replacement therapy and supplements? Hormone therapy products are effective for treating symptoms that are associated with menopause, such as hot flashes and night sweats. Hormone replacement carries certain risks, especially as you become older. If you are thinking about using estrogen or estrogen with progestin treatments, discuss the benefits and risks with your health care provider. What should I know about heart disease and stroke? Heart disease, heart attack, and stroke become more likely as you age. This may be due, in part, to the hormonal changes that your body experiences during menopause. These can affect how your body processes dietary fats, triglycerides, and cholesterol. Heart attack and stroke are both medical emergencies. There are many things that you can do to help prevent heart disease  and stroke:  Have your blood pressure checked at least every 1-2 years. High blood pressure causes heart disease and increases the risk of stroke.  If you are 53-22 years old, ask your health care provider if you should take aspirin to prevent a heart attack or a stroke.  Do not use any tobacco products, including cigarettes, chewing tobacco, or electronic cigarettes. If you need help quitting, ask your health care provider.  It is important to eat a healthy diet and maintain a healthy weight. ? Be sure to include plenty of vegetables, fruits, low-fat dairy products, and lean protein. ? Avoid eating foods that are high in solid fats, added sugars, or salt (sodium).  Get regular exercise. This is one of the most important things that you can do for your health. ? Try to exercise for at least 150 minutes each week. The type of exercise that you do should increase your heart rate and make you sweat. This is known as moderate-intensity exercise. ? Try to do strengthening exercises at least twice each week. Do these in addition to the moderate-intensity exercise.  Know your numbers.Ask your health care provider to check your cholesterol and your blood glucose. Continue to have your blood tested as directed by your health care provider.  What should I know about cancer screening? There are several types of cancer. Take the following steps to reduce your risk and to catch any cancer development as early as possible. Breast Cancer  Practice breast self-awareness. ? This means understanding how your breasts normally appear and feel. ? It also means doing regular breast self-exams. Let your health care provider know about any changes, no matter how small.  If you are 40  or older, have a clinician do a breast exam (clinical breast exam or CBE) every year. Depending on your age, family history, and medical history, it may be recommended that you also have a yearly breast X-ray (mammogram).  If you  have a family history of breast cancer, talk with your health care provider about genetic screening.  If you are at high risk for breast cancer, talk with your health care provider about having an MRI and a mammogram every year.  Breast cancer (BRCA) gene test is recommended for women who have family members with BRCA-related cancers. Results of the assessment will determine the need for genetic counseling and BRCA1 and for BRCA2 testing. BRCA-related cancers include these types: ? Breast. This occurs in males or females. ? Ovarian. ? Tubal. This may also be called fallopian tube cancer. ? Cancer of the abdominal or pelvic lining (peritoneal cancer). ? Prostate. ? Pancreatic.  Cervical, Uterine, and Ovarian Cancer Your health care provider may recommend that you be screened regularly for cancer of the pelvic organs. These include your ovaries, uterus, and vagina. This screening involves a pelvic exam, which includes checking for microscopic changes to the surface of your cervix (Pap test).  For women ages 21-65, health care providers may recommend a pelvic exam and a Pap test every three years. For women ages 79-65, they may recommend the Pap test and pelvic exam, combined with testing for human papilloma virus (HPV), every five years. Some types of HPV increase your risk of cervical cancer. Testing for HPV may also be done on women of any age who have unclear Pap test results.  Other health care providers may not recommend any screening for nonpregnant women who are considered low risk for pelvic cancer and have no symptoms. Ask your health care provider if a screening pelvic exam is right for you.  If you have had past treatment for cervical cancer or a condition that could lead to cancer, you need Pap tests and screening for cancer for at least 20 years after your treatment. If Pap tests have been discontinued for you, your risk factors (such as having a new sexual partner) need to be  reassessed to determine if you should start having screenings again. Some women have medical problems that increase the chance of getting cervical cancer. In these cases, your health care provider may recommend that you have screening and Pap tests more often.  If you have a family history of uterine cancer or ovarian cancer, talk with your health care provider about genetic screening.  If you have vaginal bleeding after reaching menopause, tell your health care provider.  There are currently no reliable tests available to screen for ovarian cancer.  Lung Cancer Lung cancer screening is recommended for adults 69-62 years old who are at high risk for lung cancer because of a history of smoking. A yearly low-dose CT scan of the lungs is recommended if you:  Currently smoke.  Have a history of at least 30 pack-years of smoking and you currently smoke or have quit within the past 15 years. A pack-year is smoking an average of one pack of cigarettes per day for one year.  Yearly screening should:  Continue until it has been 15 years since you quit.  Stop if you develop a health problem that would prevent you from having lung cancer treatment.  Colorectal Cancer  This type of cancer can be detected and can often be prevented.  Routine colorectal cancer screening usually begins at  age 42 and continues through age 45.  If you have risk factors for colon cancer, your health care provider may recommend that you be screened at an earlier age.  If you have a family history of colorectal cancer, talk with your health care provider about genetic screening.  Your health care provider may also recommend using home test kits to check for hidden blood in your stool.  A small camera at the end of a tube can be used to examine your colon directly (sigmoidoscopy or colonoscopy). This is done to check for the earliest forms of colorectal cancer.  Direct examination of the colon should be repeated every  5-10 years until age 71. However, if early forms of precancerous polyps or small growths are found or if you have a family history or genetic risk for colorectal cancer, you may need to be screened more often.  Skin Cancer  Check your skin from head to toe regularly.  Monitor any moles. Be sure to tell your health care provider: ? About any new moles or changes in moles, especially if there is a change in a mole's shape or color. ? If you have a mole that is larger than the size of a pencil eraser.  If any of your family members has a history of skin cancer, especially at a young age, talk with your health care provider about genetic screening.  Always use sunscreen. Apply sunscreen liberally and repeatedly throughout the day.  Whenever you are outside, protect yourself by wearing long sleeves, pants, a wide-brimmed hat, and sunglasses.  What should I know about osteoporosis? Osteoporosis is a condition in which bone destruction happens more quickly than new bone creation. After menopause, you may be at an increased risk for osteoporosis. To help prevent osteoporosis or the bone fractures that can happen because of osteoporosis, the following is recommended:  If you are 46-71 years old, get at least 1,000 mg of calcium and at least 600 mg of vitamin D per day.  If you are older than age 55 but younger than age 65, get at least 1,200 mg of calcium and at least 600 mg of vitamin D per day.  If you are older than age 54, get at least 1,200 mg of calcium and at least 800 mg of vitamin D per day.  Smoking and excessive alcohol intake increase the risk of osteoporosis. Eat foods that are rich in calcium and vitamin D, and do weight-bearing exercises several times each week as directed by your health care provider. What should I know about how menopause affects my mental health? Depression may occur at any age, but it is more common as you become older. Common symptoms of depression  include:  Low or sad mood.  Changes in sleep patterns.  Changes in appetite or eating patterns.  Feeling an overall lack of motivation or enjoyment of activities that you previously enjoyed.  Frequent crying spells.  Talk with your health care provider if you think that you are experiencing depression. What should I know about immunizations? It is important that you get and maintain your immunizations. These include:  Tetanus, diphtheria, and pertussis (Tdap) booster vaccine.  Influenza every year before the flu season begins.  Pneumonia vaccine.  Shingles vaccine.  Your health care provider may also recommend other immunizations. This information is not intended to replace advice given to you by your health care provider. Make sure you discuss any questions you have with your health care provider. Document Released: 12/16/2005  Document Revised: 05/13/2016 Document Reviewed: 07/28/2015 Elsevier Interactive Patient Education  2018 [employer] Inc[employer]  

## 2018-04-20 ENCOUNTER — Telehealth: Payer: Self-pay | Admitting: Family Medicine

## 2018-04-20 NOTE — Telephone Encounter (Signed)
LM with pt's husband Melanee Spryan. Melanee Spryan was advised pt is scheduled for chest CT without contrast on Tuesday 04/24/18 @ 9:30 am [facility] Advised to enter main entrance on 1st floor and go to radiology. Thanks TNP

## 2018-04-20 NOTE — Telephone Encounter (Signed)
Pt called back regarding this. Advised her appointment time and date. She asked me to stop talking and told me that she thought about this, and changed her mind. States she is not getting this done, and "good luck finding someone else to get this test". Pt hung up on me before I could advise her that this is to R/O cancer due to unintentional weight loss. She will cancel.

## 2018-04-20 NOTE — Telephone Encounter (Signed)
That is odd given that she was fine with this when we discussed it, but I can not force her to have it done.  Erasmo DownerBacigalupo, Sloane Palmer M, MD, MPH [facility] 04/20/2018 10:42 AM

## 2018-04-24 ENCOUNTER — Ambulatory Visit: Admission: RE | Admit: 2018-04-24 | Payer: Medicare Other | Source: Ambulatory Visit

## 2018-04-24 ENCOUNTER — Telehealth: Payer: Self-pay

## 2018-04-24 NOTE — Telephone Encounter (Signed)
CT scan from [place] called to let us no that patient did not show up to her appointment. sd

## 2018-04-25 NOTE — Telephone Encounter (Signed)
Pt has informed me she was going to cancel this.

## 2018-06-12 ENCOUNTER — Telehealth: Payer: Self-pay | Admitting: Family Medicine

## 2018-06-12 NOTE — Telephone Encounter (Signed)
FYI--Pt was a no show for Ct of chest °

## 2018-06-12 NOTE — Telephone Encounter (Signed)
FYI--Pt was a no show for Ct of chest

## 2018-06-13 NOTE — Telephone Encounter (Signed)
Noted  Severo Beber, Marzella SchleinAngela M, MD, MPH [facility] 06/13/2018 8:18 AM

## 2018-06-23 DIAGNOSIS — R079 Chest pain, unspecified: Secondary | ICD-10-CM | POA: Diagnosis not present

## 2018-06-23 DIAGNOSIS — R55 Syncope and collapse: Secondary | ICD-10-CM | POA: Diagnosis not present

## 2018-06-23 DIAGNOSIS — Z743 Need for continuous supervision: Secondary | ICD-10-CM | POA: Diagnosis not present

## 2018-06-23 DIAGNOSIS — S0990XA Unspecified injury of head, initial encounter: Secondary | ICD-10-CM | POA: Diagnosis not present

## 2018-06-23 DIAGNOSIS — R4020 Unspecified coma: Secondary | ICD-10-CM | POA: Diagnosis not present

## 2018-06-23 DIAGNOSIS — R9431 Abnormal electrocardiogram [ECG] [EKG]: Secondary | ICD-10-CM | POA: Diagnosis not present

## 2018-06-23 DIAGNOSIS — Z8673 Personal history of transient ischemic attack (TIA), and cerebral infarction without residual deficits: Secondary | ICD-10-CM | POA: Diagnosis not present

## 2018-06-23 DIAGNOSIS — I700 Atherosclerosis of aorta: Secondary | ICD-10-CM | POA: Diagnosis not present

## 2018-06-23 DIAGNOSIS — M47812 Spondylosis without myelopathy or radiculopathy, cervical region: Secondary | ICD-10-CM | POA: Diagnosis not present

## 2018-06-23 DIAGNOSIS — Z5329 Procedure and treatment not carried out because of patient's decision for other reasons: Secondary | ICD-10-CM | POA: Diagnosis not present

## 2018-06-23 DIAGNOSIS — R531 Weakness: Secondary | ICD-10-CM | POA: Diagnosis not present

## 2018-06-23 DIAGNOSIS — R51 Headache: Secondary | ICD-10-CM | POA: Diagnosis not present

## 2018-06-23 DIAGNOSIS — M5030 Other cervical disc degeneration, unspecified cervical region: Secondary | ICD-10-CM | POA: Diagnosis not present

## 2018-07-19 ENCOUNTER — Ambulatory Visit (INDEPENDENT_AMBULATORY_CARE_PROVIDER_SITE_OTHER): Payer: Medicare Other | Admitting: Family Medicine

## 2018-07-19 ENCOUNTER — Encounter: Payer: Self-pay | Admitting: Family Medicine

## 2018-07-19 VITALS — BP 126/80 | HR 63 | Temp 97.7°F | Wt 122.4 lb

## 2018-07-19 DIAGNOSIS — R55 Syncope and collapse: Secondary | ICD-10-CM | POA: Diagnosis not present

## 2018-07-19 DIAGNOSIS — Z803 Family history of malignant neoplasm of breast: Secondary | ICD-10-CM

## 2018-07-19 DIAGNOSIS — R918 Other nonspecific abnormal finding of lung field: Secondary | ICD-10-CM | POA: Diagnosis not present

## 2018-07-19 DIAGNOSIS — Z1231 Encounter for screening mammogram for malignant neoplasm of breast: Secondary | ICD-10-CM

## 2018-07-19 DIAGNOSIS — Z23 Encounter for immunization: Secondary | ICD-10-CM | POA: Diagnosis not present

## 2018-07-19 DIAGNOSIS — R634 Abnormal weight loss: Secondary | ICD-10-CM | POA: Diagnosis not present

## 2018-07-19 DIAGNOSIS — Z1211 Encounter for screening for malignant neoplasm of colon: Secondary | ICD-10-CM

## 2018-07-19 DIAGNOSIS — Z1239 Encounter for other screening for malignant neoplasm of breast: Secondary | ICD-10-CM

## 2018-07-19 MED ORDER — ALPRAZOLAM 0.25 MG PO TABS: ORAL_TABLET | ORAL | 0 refills | Status: DC

## 2018-07-19 NOTE — Progress Notes (Signed)
[name] Female    DOB: 1946/01/30   72 y.o.   MRN: [id number] Visit Date: 07/19/2018  Today's Provider: Shirlee Latch, MD   Chief Complaint  Patient presents with  . Weight Loss  . ER Follow Up   Subjective:    I, Presley Raddle, CMA, am acting as a scribe for Shirlee Latch, MD.   HPI  Follow up for Unintentional Weight Loss  The patient was last seen for this 3 months ago. Changes made at last visit include ordered mammogram and CT chest and encouraged patient to get colon cancer screening. She did not get any of these tests.  She has continued to lose weight unintentionally despite having good appetite.  She has lost ~40 lbs in last 2 years.  Labs have been normal. ------------------------------------------------------------------------------------  Wt Readings from Last 3 Encounters:  07/19/18 122 lb 6.4 oz (55.5 kg)  04/18/18 125 lb (56.7 kg)  02/16/18 125 lb (56.7 kg)     Follow up ER visit  Patient was seen in ER for weakness and syncope on 06/23/2018. She was visiting her daughter in [place].  She had recently flown in.  In the middle of the night, she was getting out of bed trying to adjust the covers and passed out and lost consciousness.  She hit the tile floor and hit her head.  She then was not answering questions appropriately.  They called EMS and took her to the emergency department. She was treated for Weakness. 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  Lab work showed normal CBC, CMP, troponin, with the exception of elevated BUN. She did not stay overnight in the hospital as recommended for telemetry.  It was recommended that she consider Holter monitor from  PCP. She reports this condition is slightly Improved. She states she is experiencing short term memory loss which started previous to fall.  She does occasionally feel lightheaded when she stands up quickly.  She has had no further episodes of syncope.  She denies palpitations. ------------------------------------------------------------------------------------    Allergies  Allergen Reactions  . Penicillins      Current Outpatient Medications:  .  acetaminophen (TYLENOL) 500 MG tablet, Take 500 mg by mouth every 6 (six) hours as needed for moderate pain or headache. Reported on 03/18/2016, Disp: , Rfl:  .  aspirin 81 MG tablet, Take 81 mg by mouth daily. , Disp: , Rfl:  .  atorvastatin (LIPITOR) 80 MG tablet, TAKE 1 TABLET BY MOUTH DAILY AT 6PM, Disp: 90 tablet, Rfl: 3 .  ferrous sulfate 325 (65 FE) MG tablet, Take 1 tablet (325 mg total) by mouth 2 (two) times daily with a meal., Disp: 60 tablet, Rfl: 3 .  ibuprofen (ADVIL) 200 MG tablet, Take 200 mg by mouth as needed. Reported on 03/18/2016, Disp: , Rfl:  .  MULTIPLE VITAMINS-MINERALS PO, Take 1 tablet by mouth daily., Disp: , Rfl:  .  LORazepam (ATIVAN) 0.5 MG tablet, Take 1 tab PO 30 min prior to testing (Patient not taking: Reported on 06/16/2017), Disp: 1 tablet, Rfl: 0   Review of Systems  Constitutional: Negative.        Weight loss   HENT: Negative.   Respiratory: Negative.   Cardiovascular: Negative.  Musculoskeletal: Negative.   Neurological:       Memory loss     Social History   Tobacco Use  . Smoking status: Never Smoker  . Smokeless tobacco: Never Used  Substance Use Topics  . Alcohol use: Not Currently   Objective:   BP 126/80 (BP Location: Left Arm, Patient Position: Sitting, Cuff Size: Normal)   Pulse 63   Temp 97.7 F (36.5 C) (Oral)   Wt 122 lb 6.4 oz (55.5 kg)   SpO2 99%   BMI 21.01 kg/m  Vitals:   07/19/18 0938  BP: 126/80  Pulse: 63  Temp: 97.7 F (36.5 C)  TempSrc: Oral  SpO2: 99%    Weight: 122 lb 6.4 oz (55.5 kg)    Orthostatic VS for the past 24 hrs:  BP- Lying Pulse- Lying BP- Sitting Pulse- Sitting BP- Standing at 0 minutes Pulse- Standing at 0 minutes  07/19/18 1043 137/73 56 110/72 59 120/81 71      Physical Exam  Constitutional: She is oriented to person, place, and time. She appears well-developed. She appears cachectic. She is cooperative. No distress.  HENT:  Head: Normocephalic and atraumatic.  Eyes: Conjunctivae are normal. No scleral icterus.  Neck: Neck supple. No thyromegaly present.  Cardiovascular: Normal rate, regular rhythm, normal heart sounds and intact distal pulses.  No murmur heard. Pulmonary/Chest: Effort normal and breath sounds normal. No respiratory distress. She has no wheezes. She has no rales.  Abdominal: Soft. She exhibits no distension. There is no tenderness.  Musculoskeletal: She exhibits no edema.  Lymphadenopathy:    She has no cervical adenopathy.  Neurological: She is alert and oriented to person, place, and time.  Skin: Skin is warm and dry. Capillary refill takes less than 2 seconds. No rash noted.  Psychiatric: She has a normal mood and affect. Her behavior is normal.  Vitals reviewed.       Assessment & Plan:   Problem List Items Addressed This Visit      Cardiovascular and Mediastinum   Syncope    Likely vasovagal May have some component of orthostasis From elevated BUN and recent travel, seems patient was likely somewhat dehydrated She does meet criteria for orthostasis as her blood pressure from lying to sitting drops 27 points systolic Discussed getting up slowly and staying well-hydrated This may be related to her unintentional weight loss We will set her up for a Holter monitor to ensure that there is no arrhythmia as she did not have overnight telemetry in the hospital Discussed return precautions Not taking any medications that would contribute to this      Relevant Orders   Holter monitor - 48  hour     Other   Family history of breast cancer   Relevant Orders   MM 3D SCREEN BREAST BILATERAL   Unintentional weight loss - Primary    Patient with about 50 pound weight loss over 2 years and 13 pound weight loss in the last year Laveda Norman has slowed somewhat, but she continues to lose weight despite her efforts not to She is asymptomatic with good p.o. intake She has had multiple rounds of normal labs that have been reviewed We again discussed the concern for occult malignant process She has been hesitant to have testing for this in the past, but now since she had her syncopal episode, she seems more amenable to the idea She agrees to GI referral for colon cancer screening She agrees to get mammograms for breast cancer screening, given  her family history of breast cancer She agrees to follow-up chest CT to follow-up on lung nodule and get CT abdomen pelvis at the same time We will follow-up in 3 months to go over all these results, or sooner as indicated by test results      Relevant Orders   Ambulatory referral to Gastroenterology    Other Visit Diagnoses    Abnormal weight loss       Relevant Orders   CT Abdomen Pelvis Wo Contrast   CT Chest Wo Contrast   Colon cancer screening       Relevant Orders   Ambulatory referral to Gastroenterology   Breast cancer screening       Relevant Orders   MM 3D SCREEN BREAST BILATERAL   Need for influenza vaccination       Relevant Orders   Flu vaccine HIGH DOSE PF (Completed)   Abnormal findings on diagnostic imaging of lung       Relevant Orders   CT Chest Wo Contrast       Return in about 3 months (around 10/18/2018) for weight loss f/u.   The entirety of the information documented in the History of Present Illness, Review of Systems and Physical Exam were personally obtained by me. Portions of this information were initially documented by Presley RaddleNikki Walston, CMA and reviewed by me for thoroughness and accuracy.    Erasmo DownerBacigalupo,  Waris Rodger M, MD, MPH [facility] 07/19/2018 11:39 AM

## 2018-07-19 NOTE — Assessment & Plan Note (Signed)
Likely vasovagal May have some component of orthostasis From elevated BUN and recent travel, seems patient was likely somewhat dehydrated She does meet criteria for orthostasis as her blood pressure from lying to sitting drops 27 points systolic Discussed getting up slowly and staying well-hydrated This may be related to her unintentional weight loss We will set her up for a Holter monitor to ensure that there is no arrhythmia as she did not have overnight telemetry in the hospital Discussed return precautions Not taking any medications that would contribute to this

## 2018-07-19 NOTE — Patient Instructions (Signed)
Syncope Syncope is when you temporarily lose consciousness. Syncope may also be called fainting or passing out. It is caused by a sudden decrease in blood flow to the brain. Even though most causes of syncope are not dangerous, syncope can be a sign of a serious medical problem. Signs that you may be about to faint include:  Feeling dizzy or light-headed.  Feeling nauseous.  Seeing all white or all black in your field of vision.  Having cold, clammy skin.  If you fainted, get medical help right away.Call your local emergency services (911 in the [place] Do not drive yourself to the hospital. Follow these instructions at home: Pay attention to any changes in your symptoms. Take these actions to help with your condition:  Have someone stay with you until you feel stable.  Do not drive, use machinery, or play sports until your health care provider says it is okay.  Keep all follow-up visits as told by your health care provider. This is important.  If you start to feel like you might faint, lie down right away and raise (elevate) your feet above the level of your heart. Breathe deeply and steadily. Wait until all of the symptoms have passed.  Drink enough fluid to keep your urine clear or pale yellow.  If you are taking blood pressure or heart medicine, get up slowly and take several minutes to sit and then stand. This can reduce dizziness.  Take over-the-counter and prescription medicines only as told by your health care provider.  Get help right away if:  You have a severe headache.  You have unusual pain in your chest, abdomen, or back.  You are bleeding from your mouth or rectum, or you have black or tarry stool.  You have a very fast or irregular heartbeat (palpitations).  You have pain with breathing.  You faint once or repeatedly.  You have a seizure.  You are confused.  You have trouble walking.  You have severe weakness.  You have vision problems. These  symptoms may represent a serious problem that is an emergency. Do not wait to see if your symptoms will go away. Get medical help right away. Call your local emergency services (911 in the [place] Do not drive yourself to the hospital. This information is not intended to replace advice given to you by your health care provider. Make sure you discuss any questions you have with your health care provider. Document Released: 10/24/2005 Document Revised: 03/31/2016 Document Reviewed: 07/08/2015 Elsevier Interactive Patient Education  2018 [employer] Inc[employer]  

## 2018-07-19 NOTE — Assessment & Plan Note (Signed)
Patient with about 50 pound weight loss over 2 years and 13 pound weight loss in the last year Michele Lawrence has slowed somewhat, but she continues to lose weight despite her efforts not to She is asymptomatic with good p.o. intake She has had multiple rounds of normal labs that have been reviewed We again discussed the concern for occult malignant process She has been hesitant to have testing for this in the past, but now since she had her syncopal episode, she seems more amenable to the idea She agrees to GI referral for colon cancer screening She agrees to get mammograms for breast cancer screening, given her family history of breast cancer She agrees to follow-up chest CT to follow-up on lung nodule and get CT abdomen pelvis at the same time We will follow-up in 3 months to go over all these results, or sooner as indicated by test results

## 2018-07-25 ENCOUNTER — Other Ambulatory Visit: Payer: Self-pay | Admitting: Family Medicine

## 2018-07-25 DIAGNOSIS — R911 Solitary pulmonary nodule: Secondary | ICD-10-CM

## 2018-07-25 DIAGNOSIS — R634 Abnormal weight loss: Secondary | ICD-10-CM

## 2018-07-26 ENCOUNTER — Ambulatory Visit: Payer: Medicare Other

## 2018-07-31 ENCOUNTER — Ambulatory Visit: Payer: Medicare Other

## 2018-07-31 ENCOUNTER — Ambulatory Visit
Admission: RE | Admit: 2018-07-31 | Discharge: 2018-07-31 | Disposition: A | Discharge status: Home / Self Care | Payer: Medicare Other | Source: Ambulatory Visit | Attending: Family Medicine | Admitting: Family Medicine

## 2018-07-31 DIAGNOSIS — R634 Abnormal weight loss: Secondary | ICD-10-CM | POA: Insufficient documentation

## 2018-07-31 DIAGNOSIS — I700 Atherosclerosis of aorta: Secondary | ICD-10-CM | POA: Insufficient documentation

## 2018-07-31 DIAGNOSIS — M4323 Fusion of spine, cervicothoracic region: Secondary | ICD-10-CM

## 2018-07-31 DIAGNOSIS — K449 Diaphragmatic hernia without obstruction or gangrene: Secondary | ICD-10-CM | POA: Insufficient documentation

## 2018-07-31 DIAGNOSIS — R911 Solitary pulmonary nodule: Secondary | ICD-10-CM | POA: Diagnosis not present

## 2018-07-31 DIAGNOSIS — R55 Syncope and collapse: Secondary | ICD-10-CM | POA: Insufficient documentation

## 2018-07-31 LAB — POCT I-STAT CREATININE: CREATININE: 0.8 mg/dL (ref 0.44–1.00)

## 2018-07-31 MED ORDER — IOPAMIDOL (ISOVUE-300) INJECTION 61%
100.0000 mL | Freq: Once | INTRAVENOUS | Status: AC | PRN
  Administered 2018-07-31: 75 mL via INTRAVENOUS

## 2018-08-02 ENCOUNTER — Telehealth: Payer: Self-pay

## 2018-08-02 NOTE — Telephone Encounter (Signed)
Pt's husband Michele Lawrence returned missed call. Please call back.  Thanks, [employer]

## 2018-08-02 NOTE — Telephone Encounter (Signed)
LMTCB

## 2018-08-02 NOTE — Telephone Encounter (Signed)
Patient and husband stopped by the office to get CT results. Patient advised.

## 2018-08-02 NOTE — Telephone Encounter (Signed)
-----   Message from Erasmo Downer, MD sent at 08/01/2018 10:28 AM EDT ----- Reviewing CT abd/pelvis as well as CT chest.  Lung nodule is stable and unchanged.  Radiology recommends repeat CT chest in 07/2019 to show that it has not changed in 2 years.  This will prove that it is benign.  Hiatal hernia (stomach comes through the diaphragm into the chest cavity) has increased since last year.  About 1/2 of the stomach is in the chest.  If patient has any decreased appetite or reflux symptoms, this could be contributing.  No other abnormalities and nothing to explain weight loss.  Erasmo Downer, MD, MPH [facility] 08/01/2018 10:28 AM

## 2018-08-03 ENCOUNTER — Ambulatory Visit
Admission: RE | Admit: 2018-08-03 | Discharge: 2018-08-03 | Disposition: A | Discharge status: Home / Self Care | Payer: Medicare Other | Source: Ambulatory Visit | Attending: Family Medicine | Admitting: Family Medicine

## 2018-08-03 DIAGNOSIS — R55 Syncope and collapse: Secondary | ICD-10-CM | POA: Insufficient documentation

## 2018-08-09 ENCOUNTER — Ambulatory Visit
Admission: RE | Admit: 2018-08-09 | Discharge: 2018-08-09 | Disposition: A | Discharge status: Home / Self Care | Payer: Medicare Other | Source: Ambulatory Visit | Attending: Family Medicine | Admitting: Family Medicine

## 2018-08-09 DIAGNOSIS — Z1239 Encounter for other screening for malignant neoplasm of breast: Secondary | ICD-10-CM

## 2018-08-09 DIAGNOSIS — Z1231 Encounter for screening mammogram for malignant neoplasm of breast: Secondary | ICD-10-CM | POA: Diagnosis not present

## 2018-08-09 DIAGNOSIS — Z803 Family history of malignant neoplasm of breast: Secondary | ICD-10-CM | POA: Diagnosis not present

## 2018-08-14 ENCOUNTER — Encounter: Payer: Self-pay | Admitting: Gastroenterology

## 2018-08-14 ENCOUNTER — Other Ambulatory Visit: Payer: Self-pay

## 2018-08-14 ENCOUNTER — Ambulatory Visit (INDEPENDENT_AMBULATORY_CARE_PROVIDER_SITE_OTHER): Payer: Medicare Other | Admitting: Gastroenterology

## 2018-08-14 VITALS — BP 144/84 | HR 61 | Resp 16 | Wt 124.8 lb

## 2018-08-14 DIAGNOSIS — R634 Abnormal weight loss: Secondary | ICD-10-CM | POA: Diagnosis not present

## 2018-08-14 DIAGNOSIS — E538 Deficiency of other specified B group vitamins: Secondary | ICD-10-CM | POA: Diagnosis not present

## 2018-08-14 DIAGNOSIS — R790 Abnormal level of blood mineral: Secondary | ICD-10-CM | POA: Diagnosis not present

## 2018-08-14 DIAGNOSIS — R1030 Lower abdominal pain, unspecified: Secondary | ICD-10-CM

## 2018-08-14 NOTE — Progress Notes (Signed)
Arlyss Repress, MD [address]  [address]  [place], [place] [address]  Main: [phone]  Fax: [phone]    Gastroenterology Consultation  Referring Provider:     Erasmo Downer, MD Primary Care Physician:  Erasmo Downer, MD Primary Gastroenterologist:  Dr. Arlyss Repress Reason for Consultation:     Unintentional weight loss        HPI:   Michele Lawrence is a 72 y.o. Caucasian, pleasant female referred by Dr. Beryle Flock, Marzella Schlein, MD  for consultation & management of unintentional weight loss, which has started approximately 2 years ago since mini stroke. She lost 40lbs since then.  According to husband, her weight has plateaued.  She had CT chest abdomen and pelvis with no evidence of pathology to explain her unintentional weight loss.  Lung nodule is stable.  Patient denies any GI symptoms.  She reports that she has good appetite but was losing weight.  She is taking oral iron daily along with multivitamin.  She denies rectal bleeding, nausea, vomiting, melena, hematochezia, change in stool caliber.  She denies constipation or diarrhea.  She denies having had a colonoscopy or an upper endoscopy in the past.  CT revealed large portion of the stomach in her thorax.  She is functionally independent, taking care of her grandson and 2 dogs.  She lives with her husband who is very involved in her care.  She states that she eats out almost daily and the portion size has not reduced. Her labs from last year revealed normal hemoglobin A1c, CBC, CMP, TSH.  Hep C antibody negative She does not smoke or drink alcohol She also denies being depressed  NSAIDs: None  Antiplts/Anticoagulants/Anti thrombotics: Aspirin 81  GI Procedures: None  Past Medical History:  Diagnosis Date  . Asthma   . Hyperlipidemia   . Hypertension   . Stroke [facility])     Past Surgical History:  Procedure Laterality Date  . APPENDECTOMY    . TONSILLECTOMY      Current Outpatient  Medications:  .  acetaminophen (TYLENOL) 500 MG tablet, Take 500 mg by mouth every 6 (six) hours as needed for moderate pain or headache. Reported on 03/18/2016, Disp: , Rfl:  .  ALPRAZolam (XANAX) 0.25 MG tablet, Take 1 tab PO 1 hour prior to procedure. May repeat at time of procedure if needed, Disp: 2 tablet, Rfl: 0 .  aspirin 81 MG tablet, Take 81 mg by mouth daily. , Disp: , Rfl:  .  atorvastatin (LIPITOR) 80 MG tablet, TAKE 1 TABLET BY MOUTH DAILY AT 6PM, Disp: 90 tablet, Rfl: 3 .  ferrous sulfate 325 (65 FE) MG tablet, Take 1 tablet (325 mg total) by mouth 2 (two) times daily with a meal., Disp: 60 tablet, Rfl: 3 .  ibuprofen (ADVIL) 200 MG tablet, Take 200 mg by mouth as needed. Reported on 03/18/2016, Disp: , Rfl:  .  MULTIPLE VITAMINS-MINERALS PO, Take 1 tablet by mouth daily., Disp: , Rfl:    Family History  Problem Relation Age of Onset  . Cancer Mother        breast  . Diabetes Mother   . Stroke Mother   . CVA Mother   . Cataracts Mother   . Psoriasis Mother   . Breast cancer Mother 17  . Alcohol abuse Father   . Heart disease Sister      Social History   Tobacco Use  . Smoking status: Never Smoker  . Smokeless tobacco: Never  Used  Substance Use Topics  . Alcohol use: Not Currently  . Drug use: No    Allergies as of 08/14/2018 - Review Complete 08/14/2018  Allergen Reaction Noted  . Penicillins  07/17/2015    Review of Systems:    All systems reviewed and negative except where noted in HPI.   Physical Exam:  BP (!) 144/84 (BP Location: Left Arm, Patient Position: Sitting, Cuff Size: Normal)   Pulse 61   Resp 16   Wt 124 lb 12.8 oz (56.6 kg)   BMI 21.42 kg/m  No LMP recorded. Patient is postmenopausal.  General:   Alert,  Well-developed, well-nourished, pleasant and cooperative in NAD Head:  Normocephalic and atraumatic. Eyes:  Sclera clear, no icterus.   Conjunctiva pink. Ears:  Normal auditory acuity. Nose:  No deformity, discharge, or  lesions. Mouth:  No deformity or lesions,oropharynx pink & moist. Neck:  Supple; no masses or thyromegaly. Lungs:  Respirations even and unlabored.  Clear throughout to auscultation.   No wheezes, crackles, or rhonchi. No acute distress. Heart:  Regular rate and rhythm; no murmurs, clicks, rubs, or gallops. Abdomen:  Normal bowel sounds. Soft, mild mid and lower abdominal tenderness and mildly distended without masses, hepatosplenomegaly or hernias noted.  No guarding or rebound tenderness.   Rectal: Not performed Msk:  Symmetrical without gross deformities. Good, equal movement & strength bilaterally. Pulses:  Normal pulses noted. Extremities:  No clubbing or edema.  No cyanosis. Neurologic:  Alert and oriented x3;  grossly normal neurologically. Skin:  Intact without significant lesions or rashes. No jaundice. Lymph Nodes:  No significant cervical adenopathy. Psych:  Alert and cooperative. Normal mood and affect.  Imaging Studies: Reviewed  Assessment and Plan:   Michele Lawrence is a 72 y.o. white female with history of stroke, hypertension on aspirin 81, functionally independent presents with 2 years history of unintentional weight loss despite good appetite.  She denied any GI symptoms, however she had mild abdominal tenderness on physical exam today.  Did not have a screening colonoscopy.  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

## 2018-08-15 LAB — COMPREHENSIVE METABOLIC PANEL
ALT: 57 IU/L — ABNORMAL HIGH (ref 0–32)
AST: 78 IU/L — ABNORMAL HIGH (ref 0–40)
Albumin/Globulin Ratio: 1.6 (ref 1.2–2.2)
Albumin: 4.2 g/dL (ref 3.5–4.8)
Alkaline Phosphatase: 137 IU/L — ABNORMAL HIGH (ref 39–117)
BUN / CREAT RATIO: 20 (ref 12–28)
BUN: 17 mg/dL (ref 8–27)
Bilirubin Total: 0.7 mg/dL (ref 0.0–1.2)
CO2: 24 mmol/L (ref 20–29)
CREATININE: 0.84 mg/dL (ref 0.57–1.00)
Calcium: 10.1 mg/dL (ref 8.7–10.3)
Chloride: 101 mmol/L (ref 96–106)
GFR, EST AFRICAN AMERICAN: 80 mL/min/{1.73_m2} (ref >59)
GFR, EST NON AFRICAN AMERICAN: 69 mL/min/{1.73_m2} (ref >59)
GLOBULIN, TOTAL: 2.6 g/dL (ref 1.5–4.5)
GLUCOSE: 107 mg/dL — AB (ref 65–99)
Potassium: 4.1 mmol/L (ref 3.5–5.2)
Sodium: 142 mmol/L (ref 134–144)
TOTAL PROTEIN: 6.8 g/dL (ref 6.0–8.5)

## 2018-08-15 LAB — CBC
HEMATOCRIT: 47 % — AB (ref 34.0–46.6)
HEMOGLOBIN: 15.9 g/dL (ref 11.1–15.9)
MCH: 31.6 pg (ref 26.6–33.0)
MCHC: 33.8 g/dL (ref 31.5–35.7)
MCV: 93 fL (ref 79–97)
Platelets: 173 10*3/uL (ref 150–450)
RBC: 5.03 x10E6/uL (ref 3.77–5.28)
RDW: 12.2 % — ABNORMAL LOW (ref 12.3–15.4)
WBC: 4.5 10*3/uL (ref 3.4–10.8)

## 2018-08-15 LAB — FERRITIN: FERRITIN: 77 ng/mL (ref 15–150)

## 2018-08-15 LAB — VITAMIN B12: VITAMIN B 12: 942 pg/mL (ref 232–1245)

## 2018-08-15 LAB — TSH: TSH: 3.29 u[IU]/mL (ref 0.450–4.500)

## 2018-08-21 ENCOUNTER — Telehealth: Payer: Self-pay

## 2018-08-21 NOTE — Telephone Encounter (Signed)
LVMTRC.,PC 

## 2018-08-21 NOTE — Telephone Encounter (Signed)
Pt returned call ° °teri °

## 2018-08-21 NOTE — Telephone Encounter (Signed)
-----   Message from Erasmo Downer, MD sent at 08/21/2018  8:18 AM EDT ----- Normal heart monitor, except a few premature ventricular contractions, which are not concerning and common.  Erasmo Downer, MD, MPH [facility] 08/21/2018 8:18 AM

## 2018-08-21 NOTE — Telephone Encounter (Signed)
Patient husband Melanee Spry (on [place]) was advised. ,PC

## 2018-08-21 NOTE — Telephone Encounter (Signed)
LVM for pt to let her know I received her message to cancel her EGD.  I inquired why she canceled and did not wish to be rescheduled.  Asked if there was anything we could do please let us know.  Procedure has been canceled.  Thanks [employer]

## 2018-08-27 ENCOUNTER — Ambulatory Visit: Admit: 2018-08-27 | Payer: Medicare Other | Admitting: Gastroenterology

## 2018-08-27 SURGERY — ESOPHAGOGASTRODUODENOSCOPY (EGD) WITH PROPOFOL
Anesthesia: General

## 2018-09-04 ENCOUNTER — Ambulatory Visit: Payer: Medicare Other | Admitting: Gastroenterology

## 2018-10-18 ENCOUNTER — Ambulatory Visit: Payer: Medicare Other | Admitting: Family Medicine

## 2019-02-28 ENCOUNTER — Telehealth: Payer: Self-pay

## 2019-02-28 NOTE — Telephone Encounter (Signed)
LVM-PHQ-2 screening  

## 2019-06-10 ENCOUNTER — Other Ambulatory Visit: Payer: Self-pay

## 2019-08-13 DIAGNOSIS — Z23 Encounter for immunization: Secondary | ICD-10-CM | POA: Diagnosis not present

## 2019-11-25 DIAGNOSIS — Z23 Encounter for immunization: Secondary | ICD-10-CM | POA: Diagnosis not present

## 2020-03-02 ENCOUNTER — Ambulatory Visit (INDEPENDENT_AMBULATORY_CARE_PROVIDER_SITE_OTHER): Payer: Medicare Other | Admitting: Family Medicine

## 2020-03-02 ENCOUNTER — Other Ambulatory Visit: Payer: Self-pay

## 2020-03-02 ENCOUNTER — Encounter: Payer: Self-pay | Admitting: Family Medicine

## 2020-03-02 VITALS — BP 147/75 | HR 62 | Temp 97.1°F | Resp 16 | Ht 62.0 in | Wt 125.0 lb

## 2020-03-02 DIAGNOSIS — I83893 Varicose veins of bilateral lower extremities with other complications: Secondary | ICD-10-CM | POA: Diagnosis not present

## 2020-03-02 DIAGNOSIS — F0390 Unspecified dementia without behavioral disturbance: Secondary | ICD-10-CM | POA: Diagnosis not present

## 2020-03-02 DIAGNOSIS — G301 Alzheimer's disease with late onset: Secondary | ICD-10-CM | POA: Insufficient documentation

## 2020-03-02 DIAGNOSIS — N644 Mastodynia: Secondary | ICD-10-CM

## 2020-03-02 DIAGNOSIS — R590 Localized enlarged lymph nodes: Secondary | ICD-10-CM | POA: Insufficient documentation

## 2020-03-02 DIAGNOSIS — F0280 Dementia in other diseases classified elsewhere without behavioral disturbance: Secondary | ICD-10-CM | POA: Insufficient documentation

## 2020-03-02 NOTE — Patient Instructions (Signed)

## 2020-03-02 NOTE — Assessment & Plan Note (Addendum)
Pt with new onset of pain in the right axilla Pt with right axillary lymphadenopathy on exam Pt with family history of breast cancer  Plan: Will get a right axillary ultrasound Will get a bilateral diagnostic mammogram

## 2020-03-02 NOTE — Assessment & Plan Note (Signed)
Pt with 9 month history of memory loss that is new to me Pt scored a 21/30 on mini mental today Pt with a gradual decline in memory Symptoms and History most consistent with alzheimer's dementia Memory loss may also be due to vascular dementia, given her history of TIA  Plan: Discussed with pt about starting aricept Pt and family is interested in referral to [facility] neurology Will refer to Dr. Merril Abbe at [facility] for long term management of memory

## 2020-03-02 NOTE — Progress Notes (Addendum)
Established patient visit   [name]   DOB: [birth year]   74 y.o. Female  MRN: [id number] Visit Date: 03/03/2020  Today's healthcare provider: Lavon Paganini, MD   Chief Complaint  Patient presents with  . Memory Loss  . Breast Pain   Subjective    HPI  Memory Loss History was given by the patient's husband today Pt has symptoms of memory loss noticeable within the last 9 months Husband and Children endorse declining memory Pt will repeat stories, forget things she needs to do around the house Pt has no difficulty doing ADLs, remembering names of close friends and family Pt has no changes in behavior Pt is no longer driving given concern for memory Family is interested in a referral to [facility] neurology   ------------------------------------------------------------------------------------  Right Upper Breast/Axilla Pain  Pt with 1 week history of axilla pain on the right side with sudden onset Pt states that she felt the pain while lifting her arm Pt states that she feels a new bump in her axilla Pt states the pain was worst at onset Pain has improved since onset but pt states that she still has baseline pain Arm movement makes the pain worse Pt denies anything that makes the symptoms better   Pt has a family history of breast cancer  ------------------------------------------------------------------------------------  Varicose Veins  Pt with a past medical history of varicose veins that went away for some time Pt states new varicose veins have appeared within the last week Pt she has pain associated with the varicose vein on the lower lateral aspect of her left knee  ------------------------------------------------------------------------------------  MMSE - Mini Mental State Exam 03/02/2020  Orientation to time 1  Orientation to Place 3  Registration 3  Attention/ Calculation 4  Recall 2  Language- name 2 objects 2  Language- repeat 1   Language- follow 3 step command 3  Language- read & follow direction 1  Write a sentence 1  Copy design 0  Total score 21      Social History   Tobacco Use  . Smoking status: Never Smoker  . Smokeless tobacco: Never Used  Substance Use Topics  . Alcohol use: Not Currently  . Drug use: No   Social History   Socioeconomic History  . Marital status: Married    Spouse name: Not on file  . Number of children: Not on file  . Years of education: Not on file  . Highest education level: Not on file  Occupational History  . Not on file  Tobacco Use  . Smoking status: Never Smoker  . Smokeless tobacco: Never Used  Substance and Sexual Activity  . Alcohol use: Not Currently  . Drug use: No  . Sexual activity: Not on file  Other Topics Concern  . Not on file  Social History Narrative  . Not on file   Social Determinants of Health   Financial Resource Strain:   . Difficulty of Paying Living Expenses:   Food Insecurity:   . Worried About Charity fundraiser in the Last Year:   . Arboriculturist in the Last Year:   Transportation Needs:   . Film/video editor (Medical):   Marland Kitchen Lack of Transportation (Non-Medical):   Physical Activity:   . Days of Exercise per Week:   . Minutes of Exercise per Session:   Stress:   . Feeling of Stress :   Social Connections:   . Frequency of Communication with Friends  and Family:   . Frequency of Social Gatherings with Friends and Family:   . Attends Religious Services:   . Active Member of Clubs or Organizations:   . Attends Banker Meetings:   Marland Kitchen Marital Status:   Intimate Partner Violence:   . Fear of Current or Ex-Partner:   . Emotionally Abused:   Marland Kitchen Physically Abused:   . Sexually Abused:    Family History  Problem Relation Age of Onset  . Cancer Mother        breast  . Diabetes Mother   . Stroke Mother   . CVA Mother   . Cataracts Mother   . Psoriasis Mother   . Breast cancer Mother 41  . Alcohol abuse  Father   . Heart disease Sister        Medications: Outpatient Medications Prior to Visit  Medication Sig  . acetaminophen (TYLENOL) 500 MG tablet Take 500 mg by mouth every 6 (six) hours as needed for moderate pain or headache. Reported on 03/18/2016  . ALPRAZolam (XANAX) 0.25 MG tablet Take 1 tab PO 1 hour prior to procedure. May repeat at time of procedure if needed  . aspirin 81 MG tablet Take 81 mg by mouth daily.   Marland Kitchen ibuprofen (ADVIL) 200 MG tablet Take 200 mg by mouth as needed. Reported on 03/18/2016  . MULTIPLE VITAMINS-MINERALS PO Take 1 tablet by mouth daily.  Marland Kitchen atorvastatin (LIPITOR) 80 MG tablet TAKE 1 TABLET BY MOUTH DAILY AT 6PM (Patient not taking: Reported on 03/02/2020)  . [DISCONTINUED] ferrous sulfate 325 (65 FE) MG tablet Take 1 tablet (325 mg total) by mouth 2 (two) times daily with a meal.   No facility-administered medications prior to visit.    Review of Systems  Constitutional: Negative.   HENT: Negative.   Eyes: Negative.   Respiratory: Negative.   Cardiovascular: Negative.   Gastrointestinal: Negative.   Endocrine: Negative.   Genitourinary: Negative.   Musculoskeletal: Positive for myalgias.  Skin: Negative.   Allergic/Immunologic: Negative.   Neurological: Negative.   Hematological: Negative.   Psychiatric/Behavioral: Negative.     Last CBC Lab Results  Component Value Date   WBC 4.5 08/14/2018   HGB 15.9 08/14/2018   HCT 47.0 (H) 08/14/2018   MCV 93 08/14/2018   MCH 31.6 08/14/2018   RDW 12.2 (L) 08/14/2018   PLT 173 08/14/2018   Last metabolic panel Lab Results  Component Value Date   GLUCOSE 107 (H) 08/14/2018   NA 142 08/14/2018   K 4.1 08/14/2018   CL 101 08/14/2018   CO2 24 08/14/2018   BUN 17 08/14/2018   CREATININE 0.84 08/14/2018   GFRNONAA 69 08/14/2018   GFRAA 80 08/14/2018   CALCIUM 10.1 08/14/2018   PROT 6.8 08/14/2018   ALBUMIN 4.2 08/14/2018   LABGLOB 2.6 08/14/2018   AGRATIO 1.6 08/14/2018   BILITOT 0.7  08/14/2018   ALKPHOS 137 (H) 08/14/2018   AST 78 (H) 08/14/2018   ALT 57 (H) 08/14/2018   ANIONGAP 6 06/16/2017   Last lipids Lab Results  Component Value Date   CHOL 140 09/27/2017   HDL 52 09/27/2017   LDLCALC 72 09/27/2017   TRIG 76 09/27/2017   CHOLHDL 2.7 09/27/2017   Last hemoglobin A1c Lab Results  Component Value Date   HGBA1C 5.1 09/27/2017   Last thyroid functions Lab Results  Component Value Date   TSH 3.290 08/14/2018   Last vitamin D No results found for: 25OHVITD2, 25OHVITD3, VD25OH Last vitamin B12  and Folate Lab Results  Component Value Date   VITAMINB12 942 08/14/2018       Objective    BP (!) 147/75 (BP Location: Left Arm, Patient Position: Sitting, Cuff Size: Normal)   Pulse 62   Temp (!) 97.1 F (36.2 C) (Temporal)   Resp 16   Ht 5\' 2"  (1.575 m)   Wt 125 lb (56.7 kg)   BMI 22.86 kg/m  BP Readings from Last 3 Encounters:  03/02/20 (!) 147/75  08/14/18 (!) 144/84  07/19/18 126/80   Wt Readings from Last 3 Encounters:  03/02/20 125 lb (56.7 kg)  08/14/18 124 lb 12.8 oz (56.6 kg)  07/19/18 122 lb 6.4 oz (55.5 kg)      Physical Exam Exam conducted with a chaperone present.  Constitutional:      Appearance: Normal appearance.  HENT:     Head: Normocephalic and atraumatic.     Mouth/Throat:     Mouth: Mucous membranes are moist.     Pharynx: Oropharynx is clear.  Eyes:     General: No visual field deficit.    Extraocular Movements: Extraocular movements intact.     Right eye: No nystagmus.     Left eye: No nystagmus.     Conjunctiva/sclera: Conjunctivae normal.     Pupils: Pupils are equal, round, and reactive to light.  Cardiovascular:     Rate and Rhythm: Normal rate and regular rhythm.     Pulses: Normal pulses.     Heart sounds: Normal heart sounds.  Pulmonary:     Effort: Pulmonary effort is normal.     Breath sounds: Normal breath sounds.  Chest:     Chest wall: No mass, lacerations, deformity, swelling, tenderness,  crepitus or edema.     Breasts: Breasts are symmetrical.        Right: No swelling, bleeding, inverted nipple, mass, nipple discharge, skin change or tenderness.        Left: No swelling, bleeding, inverted nipple, mass, nipple discharge, skin change or tenderness.  Abdominal:     General: Abdomen is flat. Bowel sounds are normal.     Palpations: Abdomen is soft.  Musculoskeletal:        General: No deformity or signs of injury. Normal range of motion.     Cervical back: Neck supple.     Right lower leg: Swelling and tenderness present. Edema present.     Left lower leg: Swelling and tenderness present. Edema present.     Comments: Varicose veins are present diffusely bilaterally  Lymphadenopathy:     Upper Body:     Right upper body: Axillary adenopathy present.     Left upper body: No axillary adenopathy.     Comments: Axillary adenopathy is tender to palpation  Skin:    General: Skin is warm and dry.     Capillary Refill: Capillary refill takes less than 2 seconds.  Neurological:     General: No focal deficit present.     Mental Status: She is alert and oriented to person, place, and time.     Cranial Nerves: Cranial nerves are intact. No cranial nerve deficit, dysarthria or facial asymmetry.     Sensory: Sensation is intact.     Motor: Motor function is intact. No tremor or abnormal muscle tone.     Deep Tendon Reflexes: Reflexes are normal and symmetric.     Reflex Scores:      Tricep reflexes are 2+ on the right side and 2+ on the left  side.      Bicep reflexes are 2+ on the right side and 2+ on the left side.      Brachioradialis reflexes are 2+ on the right side and 2+ on the left side.      Patellar reflexes are 2+ on the right side and 2+ on the left side.      Achilles reflexes are 2+ on the right side and 2+ on the left side. Psychiatric:        Attention and Perception: Attention and perception normal.        Mood and Affect: Mood and affect normal.        Speech:  Speech normal.        Behavior: Behavior normal.        Thought Content: Thought content normal.        Cognition and Memory: Memory is impaired.        Judgment: Judgment normal.     Comments: Mini Mental Exam today was 21/30      No results found for any visits on 03/02/20.  Assessment & Plan     Problem List Items Addressed This Visit      Nervous and Auditory   Dementia without behavioral disturbance (HCC) - Primary    Pt with 9 month history of memory loss that is new to me Pt scored a 21/30 on mini mental today Pt with a gradual decline in memory Symptoms and History most consistent with alzheimer's dementia Memory loss may also be due to vascular dementia, given her history of TIA  Plan: Discussed with pt about starting aricept Pt and family is interested in referral to [facility] neurology Will refer to Dr. Merril [facility]be at [facility] for long term management of memory        Relevant Orders   Ambulatory referral to Neurology     Immune and Lymphatic   Axillary adenopathy    Pt with new onset of pain in the right axilla Pt with right axillary lymphadenopathy on exam Pt with family history of breast cancer  Plan: Will get a right axillary ultrasound Will get a bilateral diagnostic mammogram       Relevant Orders   US BREAST LTD UNI RIGHT INC AXILLA   MM DIAG BREAST TOMO BILATERAL    Other Visit Diagnoses    Varicose veins of both legs with edema       Breast pain, right       Relevant Orders   US BREAST LTD UNI RIGHT INC AXILLA   MM DIAG BREAST TOMO BILATERAL     Varicose Veins Pt with recurrence of varicose veins with pain and edema  Plan: Recommend wearing compression socks Recommend leg elevation   Return if symptoms worsen or fail to improve.      Terrill Mohr, Medical Student [facility]  [facility] [phone] (phone) [phone] (fax)  [facility]

## 2020-03-03 ENCOUNTER — Telehealth: Payer: Self-pay | Admitting: Family Medicine

## 2020-03-03 DIAGNOSIS — N644 Mastodynia: Secondary | ICD-10-CM

## 2020-03-03 NOTE — Telephone Encounter (Signed)
I need to know clock position of breast pain and they usually want limited  Breast ultrasound of opposite side added as well

## 2020-03-04 NOTE — Telephone Encounter (Signed)
R breast 10 oclock Added L [place] as well

## 2020-03-11 ENCOUNTER — Telehealth: Payer: Self-pay

## 2020-03-11 NOTE — Telephone Encounter (Signed)
Spoke with Mr. Mayo Ao.  He is going to call the Neurologist and schedule an appointment.

## 2020-03-11 NOTE — Telephone Encounter (Signed)
-----   Message from Erasmo Downer, MD sent at 03/11/2020  3:01 PM EDT -----  ----- Message ----- From: Myles Lipps, CMA Sent: 03/11/2020   2:31 PM EDT To: Erasmo Downer, MD   ----- Message ----- From: Erasmo Downer, MD Sent: 03/11/2020   1:45 PM EDT To: Beryle Flock Nurse  Please reach out to patient and advise her to call to schedule an appt with Neurology as she requested. Thanks! ----- Message ----- From: Raquel Sarna Sent: 03/11/2020   1:16 PM EDT To: Erasmo Downer, MD  Please review incoming fax.

## 2020-03-12 ENCOUNTER — Ambulatory Visit
Admission: RE | Admit: 2020-03-12 | Discharge: 2020-03-12 | Disposition: A | Discharge status: Home / Self Care | Payer: Medicare Other | Source: Ambulatory Visit | Attending: Family Medicine | Admitting: Family Medicine

## 2020-03-12 DIAGNOSIS — R928 Other abnormal and inconclusive findings on diagnostic imaging of breast: Secondary | ICD-10-CM | POA: Diagnosis not present

## 2020-03-12 DIAGNOSIS — N644 Mastodynia: Secondary | ICD-10-CM

## 2020-03-12 DIAGNOSIS — R590 Localized enlarged lymph nodes: Secondary | ICD-10-CM

## 2020-03-12 DIAGNOSIS — N6489 Other specified disorders of breast: Secondary | ICD-10-CM | POA: Diagnosis not present

## 2020-03-13 ENCOUNTER — Telehealth: Payer: Self-pay

## 2020-03-13 NOTE — Telephone Encounter (Signed)
Patient advised.

## 2020-03-13 NOTE — Telephone Encounter (Signed)
-----   Message from Erasmo Downer, MD sent at 03/13/2020  9:45 AM EDT ----- Normal mammogram/ultrasound. Repeat screening mammogram in 1 yr

## 2020-03-17 ENCOUNTER — Telehealth: Payer: Self-pay | Admitting: Family Medicine

## 2020-03-17 ENCOUNTER — Telehealth: Payer: Self-pay

## 2020-03-17 NOTE — Telephone Encounter (Signed)
Michele Lawrence came by the office to get results of mammogram and ultrasound.  Please call him because he has questions regarding ultrasound.

## 2020-03-17 NOTE — Telephone Encounter (Signed)
Patient advised of normal mammogram.

## 2020-03-17 NOTE — Telephone Encounter (Signed)
Patient advised of normal mammogram/ultrasound.

## 2020-03-17 NOTE — Telephone Encounter (Signed)
Copied from CRM [id number]. Topic: General - Other >> Mar 17, 2020  9:35 AM Marylen Ponto wrote: Reason for CRM: Pt husband called and asked that they be contacted to discuss pt mammogram results. Cb# [phone]

## 2020-03-30 ENCOUNTER — Encounter: Payer: Self-pay | Admitting: Family Medicine

## 2020-03-30 ENCOUNTER — Ambulatory Visit (INDEPENDENT_AMBULATORY_CARE_PROVIDER_SITE_OTHER): Payer: Medicare Other | Admitting: Family Medicine

## 2020-03-30 ENCOUNTER — Other Ambulatory Visit: Payer: Self-pay

## 2020-03-30 VITALS — BP 137/84 | HR 91 | Temp 97.1°F | Resp 16 | Wt 125.0 lb

## 2020-03-30 DIAGNOSIS — M6289 Other specified disorders of muscle: Secondary | ICD-10-CM | POA: Diagnosis not present

## 2020-03-30 DIAGNOSIS — M25511 Pain in right shoulder: Secondary | ICD-10-CM

## 2020-03-30 MED ORDER — BACLOFEN 10 MG PO TABS: 10.0000 mg | ORAL_TABLET | Freq: Three times a day (TID) | ORAL | 0 refills | Status: DC

## 2020-03-30 MED ORDER — MELOXICAM 15 MG PO TABS: 15.0000 mg | ORAL_TABLET | Freq: Every day | ORAL | 0 refills | Status: DC

## 2020-03-30 NOTE — Progress Notes (Signed)
See note from Daniel Le, medical student, attested by me from same date of service  

## 2020-03-30 NOTE — Progress Notes (Signed)
Established patient visit   [name]   DOB: 1946/07/20   74 y.o. Female  MRN: [id number] Visit Date: 03/30/2020  Today's healthcare provider: Lavon Paganini, MD   Chief Complaint  Patient presents with  . Breast Pain   Subjective    HPI  Follow up for Right Axillary/Breast Pain  The patient was last seen for this 1 months ago. Changes made at last visit include referral for [place] and Diagnostic Mammography. Impression of mammography and ultrasound indicates no mammographic evidence for malignancy and no suspicious abnormality within the right axilla.  She feels that condition is Worse. Pt states that pain has been worse and feels lump located in right axilla growing in size She states symptoms are a sharp pain that is worse with movement. Pt also has a constant dull ache in axilla Pt is currently in the middle of a move to a new house and is lifting a lot of boxes Pt is taking ibuprofen with no relief from symptoms   ------------------------------------------------------------------------------------     Social History   Tobacco Use  . Smoking status: Never Smoker  . Smokeless tobacco: Never Used  Substance Use Topics  . Alcohol use: Not Currently  . Drug use: No   Social History   Socioeconomic History  . Marital status: Married    Spouse name: Not on file  . Number of children: Not on file  . Years of education: Not on file  . Highest education level: Not on file  Occupational History  . Not on file  Tobacco Use  . Smoking status: Never Smoker  . Smokeless tobacco: Never Used  Substance and Sexual Activity  . Alcohol use: Not Currently  . Drug use: No  . Sexual activity: Not on file  Other Topics Concern  . Not on file  Social History Narrative  . Not on file   Social Determinants of Health   Financial Resource Strain:   . Difficulty of Paying Living Expenses:   Food Insecurity:   . Worried About Charity fundraiser in  the Last Year:   . Arboriculturist in the Last Year:   Transportation Needs:   . Film/video editor (Medical):   Marland Kitchen Lack of Transportation (Non-Medical):   Physical Activity:   . Days of Exercise per Week:   . Minutes of Exercise per Session:   Stress:   . Feeling of Stress :   Social Connections:   . Frequency of Communication with Friends and Family:   . Frequency of Social Gatherings with Friends and Family:   . Attends Religious Services:   . Active Member of Clubs or Organizations:   . Attends Archivist Meetings:   Marland Kitchen Marital Status:   Intimate Partner Violence:   . Fear of Current or Ex-Partner:   . Emotionally Abused:   Marland Kitchen Physically Abused:   . Sexually Abused:        Medications: Outpatient Medications Prior to Visit  Medication Sig  . acetaminophen (TYLENOL) 500 MG tablet Take 500 mg by mouth every 6 (six) hours as needed for moderate pain or headache. Reported on 03/18/2016  . Apoaequorin (PREVAGEN PO) Take by mouth daily.  Marland Kitchen aspirin 81 MG tablet Take 81 mg by mouth daily.   Marland Kitchen ibuprofen (ADVIL) 200 MG tablet Take 200 mg by mouth as needed. Reported on 03/18/2016  . MULTIPLE VITAMINS-MINERALS PO Take 1 tablet by mouth daily.  Marland Kitchen ALPRAZolam (XANAX) 0.25 MG  tablet Take 1 tab PO 1 hour prior to procedure. May repeat at time of procedure if needed (Patient not taking: Reported on 03/30/2020)  . [DISCONTINUED] atorvastatin (LIPITOR) 80 MG tablet TAKE 1 TABLET BY MOUTH DAILY AT 6PM (Patient not taking: Reported on 03/02/2020)   No facility-administered medications prior to visit.    Review of Systems  Constitutional: Negative.   HENT: Negative.   Eyes: Negative.   Respiratory: Negative.   Cardiovascular: Negative.   Gastrointestinal: Negative.   Endocrine: Negative.   Genitourinary: Negative.   Musculoskeletal: Positive for arthralgias and myalgias. Negative for back pain, gait problem, joint swelling, neck pain and neck stiffness.  Skin: Negative.     Allergic/Immunologic: Negative.   Neurological: Negative.   Hematological: Negative.   Psychiatric/Behavioral: Negative.        Objective    BP 137/84 (BP Location: Left Arm, Patient Position: Sitting, Cuff Size: Normal)   Pulse 91   Temp (!) 97.1 F (36.2 C) (Temporal)   Resp 16   Wt 125 lb (56.7 kg)   SpO2 98%   BMI 22.86 kg/m  BP Readings from Last 3 Encounters:  03/30/20 137/84  03/02/20 (!) 147/75  08/14/18 (!) 144/84   Wt Readings from Last 3 Encounters:  03/30/20 125 lb (56.7 kg)  03/02/20 125 lb (56.7 kg)  08/14/18 124 lb 12.8 oz (56.6 kg)      Physical Exam Constitutional:      Appearance: Normal appearance. She is normal weight.  HENT:     Head: Normocephalic and atraumatic.  Cardiovascular:     Rate and Rhythm: Normal rate and regular rhythm.     Pulses: Normal pulses.     Heart sounds: Normal heart sounds.  Pulmonary:     Effort: Pulmonary effort is normal.     Breath sounds: Normal breath sounds.  Musculoskeletal:     Right shoulder: Tenderness (diffuse tenderness to palpation in the distribution of the trapezius, latisimus dorsi and all areas around the shoulder and) present. No swelling, deformity, effusion, laceration, bony tenderness or crepitus. Decreased range of motion (decreased active ROM due to pain, normal passive ROM with pain). Normal strength. Normal pulse.     Left shoulder: Tenderness (tenderness to palpation in the distribution of the trapezius bilaterally) present. No swelling, deformity, effusion, laceration, bony tenderness or crepitus. Normal range of motion. Normal strength. Normal pulse.     Right upper arm: Tenderness (diffuse tenderness to palpation) present. No swelling, edema, deformity, lacerations or bony tenderness.     Left upper arm: No swelling, edema, deformity, lacerations, tenderness or bony tenderness.     Right elbow: Normal.     Left elbow: Normal.     Right forearm: Normal.     Left forearm: Normal.     Cervical  back: Normal.     Thoracic back: Tenderness (tenderness to palpation along the distribution of the trapezius) present. No swelling, edema, deformity, signs of trauma, lacerations, spasms or bony tenderness. Normal range of motion. No scoliosis.     Lumbar back: Normal.  Skin:    General: Skin is warm and dry.     Capillary Refill: Capillary refill takes less than 2 seconds.  Neurological:     General: No focal deficit present.     Mental Status: She is alert and oriented to person, place, and time.     Motor: Motor function is intact. No weakness, tremor, atrophy, abnormal muscle tone or seizure activity.     Coordination: Coordination is intact.  Coordination normal. Finger-Nose-Finger Test normal.     Deep Tendon Reflexes: Reflexes are normal and symmetric.     Reflex Scores:      Tricep reflexes are 2+ on the right side and 2+ on the left side.      Bicep reflexes are 2+ on the right side and 2+ on the left side. Psychiatric:        Mood and Affect: Mood normal.        Behavior: Behavior normal.      No results found for any visits on 03/30/20.  Assessment & Plan     1. Muscle tightness / Acute pain of right shoulder Pt with 6 week history of shoulder pain felt worst in the right axillary area Pt has diffuse tenderness and tightness on palpation on trapezius bilaterally and diffusely around right shoulder Malignancy is unlikely given normal findings on [place] and mammography Symptoms most consistent with muscle tightness  Plan: Recommend using a heating pad on upper thoracic spin and shoulder for 20 minutes every hour Recommend doing shoulder exercises to help muscle tightness Will start taking meloxicam Will start taking baclofen    Return if symptoms worsen or fail to improve: Call the office in 2 weeks to update.      Terrill Mohr, Medical Student [facility]  [facility] [phone] (phone) [phone] (fax)  [facility]

## 2020-03-30 NOTE — Patient Instructions (Signed)

## 2020-04-01 DIAGNOSIS — N390 Urinary tract infection, site not specified: Secondary | ICD-10-CM | POA: Diagnosis not present

## 2020-04-01 DIAGNOSIS — Z7409 Other reduced mobility: Secondary | ICD-10-CM | POA: Diagnosis not present

## 2020-04-01 DIAGNOSIS — Z79899 Other long term (current) drug therapy: Secondary | ICD-10-CM | POA: Diagnosis not present

## 2020-04-01 DIAGNOSIS — R531 Weakness: Secondary | ICD-10-CM | POA: Diagnosis not present

## 2020-04-01 DIAGNOSIS — Z880 Allergy status to penicillin: Secondary | ICD-10-CM | POA: Diagnosis not present

## 2020-04-01 DIAGNOSIS — I444 Left anterior fascicular block: Secondary | ICD-10-CM | POA: Diagnosis not present

## 2020-04-01 DIAGNOSIS — E860 Dehydration: Secondary | ICD-10-CM | POA: Diagnosis not present

## 2020-04-01 DIAGNOSIS — E785 Hyperlipidemia, unspecified: Secondary | ICD-10-CM | POA: Diagnosis not present

## 2020-04-01 DIAGNOSIS — R634 Abnormal weight loss: Secondary | ICD-10-CM | POA: Diagnosis not present

## 2020-04-01 DIAGNOSIS — F0390 Unspecified dementia without behavioral disturbance: Secondary | ICD-10-CM | POA: Diagnosis not present

## 2020-04-01 DIAGNOSIS — Z8673 Personal history of transient ischemic attack (TIA), and cerebral infarction without residual deficits: Secondary | ICD-10-CM | POA: Diagnosis not present

## 2020-04-01 DIAGNOSIS — R4182 Altered mental status, unspecified: Secondary | ICD-10-CM | POA: Diagnosis not present

## 2020-04-01 DIAGNOSIS — I499 Cardiac arrhythmia, unspecified: Secondary | ICD-10-CM | POA: Diagnosis not present

## 2020-04-01 DIAGNOSIS — Z20822 Contact with and (suspected) exposure to covid-19: Secondary | ICD-10-CM | POA: Diagnosis not present

## 2020-04-01 DIAGNOSIS — Z6822 Body mass index (BMI) 22.0-22.9, adult: Secondary | ICD-10-CM | POA: Diagnosis not present

## 2020-04-01 DIAGNOSIS — B9689 Other specified bacterial agents as the cause of diseases classified elsewhere: Secondary | ICD-10-CM | POA: Diagnosis not present

## 2020-04-01 DIAGNOSIS — F05 Delirium due to known physiological condition: Secondary | ICD-10-CM | POA: Diagnosis not present

## 2020-04-01 DIAGNOSIS — F0391 Unspecified dementia with behavioral disturbance: Secondary | ICD-10-CM | POA: Diagnosis not present

## 2020-04-01 DIAGNOSIS — D508 Other iron deficiency anemias: Secondary | ICD-10-CM | POA: Diagnosis not present

## 2020-04-01 DIAGNOSIS — Z7982 Long term (current) use of aspirin: Secondary | ICD-10-CM | POA: Diagnosis not present

## 2020-04-01 DIAGNOSIS — Z66 Do not resuscitate: Secondary | ICD-10-CM | POA: Insufficient documentation

## 2020-04-01 DIAGNOSIS — I10 Essential (primary) hypertension: Secondary | ICD-10-CM | POA: Diagnosis not present

## 2020-04-02 DIAGNOSIS — N390 Urinary tract infection, site not specified: Secondary | ICD-10-CM | POA: Diagnosis not present

## 2020-04-02 DIAGNOSIS — F0390 Unspecified dementia without behavioral disturbance: Secondary | ICD-10-CM | POA: Diagnosis not present

## 2020-04-02 DIAGNOSIS — E860 Dehydration: Secondary | ICD-10-CM | POA: Diagnosis not present

## 2020-04-02 DIAGNOSIS — R634 Abnormal weight loss: Secondary | ICD-10-CM | POA: Diagnosis not present

## 2020-04-02 DIAGNOSIS — R410 Disorientation, unspecified: Secondary | ICD-10-CM | POA: Diagnosis not present

## 2020-04-02 MED ORDER — ASPIRIN 81 MG PO CHEW
81.00 | CHEWABLE_TABLET | ORAL | Status: DC
Start: 2020-04-04 — End: 2020-04-02

## 2020-04-02 MED ORDER — GENERIC EXTERNAL MEDICATION
Status: DC
Start: ? — End: 2020-04-02

## 2020-04-02 MED ORDER — ACETAMINOPHEN 325 MG PO TABS
650.00 | ORAL_TABLET | ORAL | Status: DC
Start: ? — End: 2020-04-02

## 2020-04-02 MED ORDER — MELATONIN 3 MG PO TABS
3.00 | ORAL_TABLET | ORAL | Status: DC
Start: ? — End: 2020-04-02

## 2020-04-02 MED ORDER — GUAIFENESIN 100 MG/5ML PO SYRP
200.00 | ORAL_SOLUTION | ORAL | Status: DC
Start: ? — End: 2020-04-02

## 2020-04-02 MED ORDER — THERA-M PO TABS
1.00 | ORAL_TABLET | ORAL | Status: DC
Start: 2020-04-04 — End: 2020-04-02

## 2020-04-02 MED ORDER — GENERIC EXTERNAL MEDICATION
1.00 | Status: DC
Start: 2020-04-02 — End: 2020-04-02

## 2020-04-02 MED ORDER — ENOXAPARIN SODIUM 40 MG/0.4ML ~~LOC~~ SOLN
40.00 | SUBCUTANEOUS | Status: DC
Start: 2020-04-04 — End: 2020-04-02

## 2020-04-02 MED ORDER — ALUM & MAG HYDROXIDE-SIMETH 400-400-40 MG/5ML PO SUSP
30.00 | ORAL | Status: DC
Start: ? — End: 2020-04-02

## 2020-04-02 MED ORDER — SENNOSIDES 8.6 MG PO TABS
2.00 | ORAL_TABLET | ORAL | Status: DC
Start: ? — End: 2020-04-02

## 2020-04-03 DIAGNOSIS — R410 Disorientation, unspecified: Secondary | ICD-10-CM | POA: Diagnosis not present

## 2020-04-03 DIAGNOSIS — N390 Urinary tract infection, site not specified: Secondary | ICD-10-CM | POA: Diagnosis not present

## 2020-04-03 DIAGNOSIS — E860 Dehydration: Secondary | ICD-10-CM | POA: Diagnosis not present

## 2020-04-03 DIAGNOSIS — F0390 Unspecified dementia without behavioral disturbance: Secondary | ICD-10-CM | POA: Diagnosis not present

## 2020-04-03 DIAGNOSIS — R634 Abnormal weight loss: Secondary | ICD-10-CM | POA: Diagnosis not present

## 2020-04-03 MED ORDER — GENERIC EXTERNAL MEDICATION
Status: DC
Start: ? — End: 2020-04-03

## 2020-04-05 ENCOUNTER — Other Ambulatory Visit: Payer: Self-pay | Admitting: Family Medicine

## 2020-04-05 DIAGNOSIS — M6289 Other specified disorders of muscle: Secondary | ICD-10-CM

## 2020-04-05 NOTE — Telephone Encounter (Signed)
Requested medications are due for refill today?  Yes - This refill cannot be delegated.    Requested medications are on active medication list?  Yes  Last Refill:   03/30/2020  # 30 with no refills  Future visit scheduled?  No   Notes to Clinic:  This refill cannot be delegated.

## 2020-06-08 DIAGNOSIS — F0390 Unspecified dementia without behavioral disturbance: Secondary | ICD-10-CM | POA: Diagnosis not present

## 2020-06-16 DIAGNOSIS — R4189 Other symptoms and signs involving cognitive functions and awareness: Secondary | ICD-10-CM | POA: Diagnosis not present

## 2020-06-16 DIAGNOSIS — F0390 Unspecified dementia without behavioral disturbance: Secondary | ICD-10-CM | POA: Diagnosis not present

## 2020-06-16 DIAGNOSIS — R9089 Other abnormal findings on diagnostic imaging of central nervous system: Secondary | ICD-10-CM | POA: Diagnosis not present

## 2020-06-16 DIAGNOSIS — I679 Cerebrovascular disease, unspecified: Secondary | ICD-10-CM | POA: Diagnosis not present

## 2020-07-22 NOTE — Progress Notes (Signed)
Established patient visit   [name]   DOB: 1946-10-30   74 y.o. Female  MRN: [id number] Visit Date: 07/23/2020  Today's healthcare provider: Trey Sailors, PA-C   Chief Complaint  Patient presents with  . Rash  I,Ronell Boldin M Dayquan Buys,acting as a scribe for [employer], PA-C.,have documented all relevant documentation on the behalf of Trey Sailors, PA-C,as directed by  Trey Sailors, PA-C while in the presence of Trey Sailors, PA-C.  Subjective    Rash This is a new problem. The current episode started 1 to 4 weeks ago. The problem has been gradually worsening since onset. The affected locations include the groin, genitalia, left arm and right arm. The rash is characterized by redness and itchiness. Pertinent negatives include no joint pain, shortness of breath or sore throat. Past treatments include moisturizer. The treatment provided mild relief.    She has recently been started on Aricept for dementia and has increased to full dose. The rash is itchy. The rash is also spreading.      Medications: Outpatient Medications Prior to Visit  Medication Sig  . acetaminophen (TYLENOL) 500 MG tablet Take 500 mg by mouth every 6 (six) hours as needed for moderate pain or headache. Reported on 03/18/2016  . Apoaequorin (PREVAGEN PO) Take by mouth daily.  Marland Kitchen aspirin 81 MG tablet Take 81 mg by mouth daily.   Marland Kitchen donepezil (ARICEPT) 10 MG tablet Take by mouth.  Marland Kitchen ibuprofen (ADVIL) 200 MG tablet Take 200 mg by mouth as needed. Reported on 03/18/2016  . MULTIPLE VITAMINS-MINERALS PO Take 1 tablet by mouth daily.  Marland Kitchen ALPRAZolam (XANAX) 0.25 MG tablet Take 1 tab PO 1 hour prior to procedure. May repeat at time of procedure if needed (Patient not taking: Reported on 03/30/2020)  . baclofen (LIORESAL) 10 MG tablet TAKE ONE TABLET BY MOUTH THREE TIMES A DAY (Patient not taking: Reported on 07/23/2020)  . meloxicam (MOBIC) 15 MG tablet Take 1 tablet (15 mg total) by mouth  daily. (Patient not taking: Reported on 07/23/2020)   No facility-administered medications prior to visit.    Review of Systems  HENT: Negative for sore throat.   Respiratory: Negative for shortness of breath.   Musculoskeletal: Negative for joint pain.  Skin: Positive for rash.      Objective    BP 136/64 (BP Location: Right Arm, Patient Position: Sitting, Cuff Size: Large)   Pulse 61   Temp 97.6 F (36.4 C) (Oral)   Wt 122 lb 9.6 oz (55.6 kg)   SpO2 100%   BMI 22.42 kg/m    Physical Exam Constitutional:      Appearance: Normal appearance. She is normal weight.  Skin:    General: Skin is warm and dry.     Findings: Erythema and rash present.          Comments: There is a 4 cm in diameter ellipsoid erythematous lesion on the flexural surface of her left elbow. It is not scaling, there is no bleeding. There is a confluence of erythematous macules in her groin and inner thighs. A similar rash extends across her low back.   Neurological:     General: No focal deficit present.     Mental Status: She is alert.  Psychiatric:        Mood and Affect: Mood normal.        Behavior: Behavior normal.      No results found for any visits on  07/23/20.  Assessment & Plan    1. Dementia without behavioral disturbance, unspecified dementia type [facility]) She has just started on aricept 06/08/2020 with [facility] neurology. Recently increased to full dose.   2. Rash and nonspecific skin eruption  Rash does seem consistent with drug eruption as it is across large portions of her body including her torso and groin which are not typically affected in contact dermatitis. Treat as below. They may discuss namenda with neurology as alternative. Would recommend tapering down off aricept to see if this resolves rash.   - predniSONE (DELTASONE) 10 MG tablet; Take 6 pills on day 1, 5 pills on day 2 and so on until complete.  Dispense: 21 tablet; Refill: 0    Return if symptoms worsen or fail to  improve.      ITrey Sailors, PA-C, have reviewed all documentation for this visit. The documentation on 07/23/20 for the exam, diagnosis, procedures, and orders are all accurate and complete.  The entirety of the information documented in the History of Present Illness, Review of Systems and Physical Exam were personally obtained by me. Portions of this information were initially documented by [facility] and reviewed by me for thoroughness and accuracy.     Maryella Shivers  [facility] [phone] (phone) [phone] (fax)  [facility]

## 2020-07-23 ENCOUNTER — Encounter: Payer: Self-pay | Admitting: Physician Assistant

## 2020-07-23 ENCOUNTER — Other Ambulatory Visit: Payer: Self-pay

## 2020-07-23 ENCOUNTER — Ambulatory Visit (INDEPENDENT_AMBULATORY_CARE_PROVIDER_SITE_OTHER): Payer: Medicare Other | Admitting: Physician Assistant

## 2020-07-23 VITALS — BP 136/64 | HR 61 | Temp 97.6°F | Wt 122.6 lb

## 2020-07-23 DIAGNOSIS — F0390 Unspecified dementia without behavioral disturbance: Secondary | ICD-10-CM | POA: Diagnosis not present

## 2020-07-23 DIAGNOSIS — R21 Rash and other nonspecific skin eruption: Secondary | ICD-10-CM

## 2020-07-23 IMAGING — US US BREAST*R* LIMITED INC AXILLA
1 series · 5 of 5 positions shown · non-contrast
Comparison: Previous exam(s).

CLINICAL DATA: Patient presents for evaluation of right axillary
palpable area of concern.

EXAM:
DIGITAL DIAGNOSTIC BILATERAL MAMMOGRAM WITH CAD AND TOMO
ULTRASOUND RIGHT BREAST

[Series 1: us breast*right* limited inc axilla · 0.07mm/px · 5 of 5 slices shown]
[im 1/5]
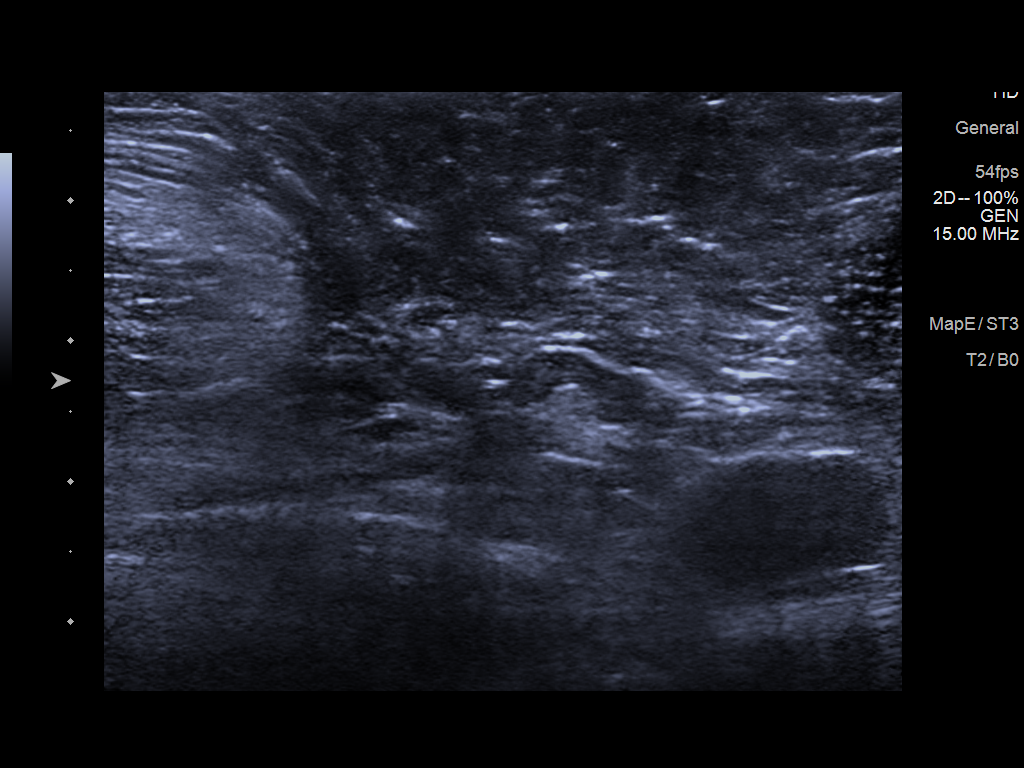
[im 2/5]
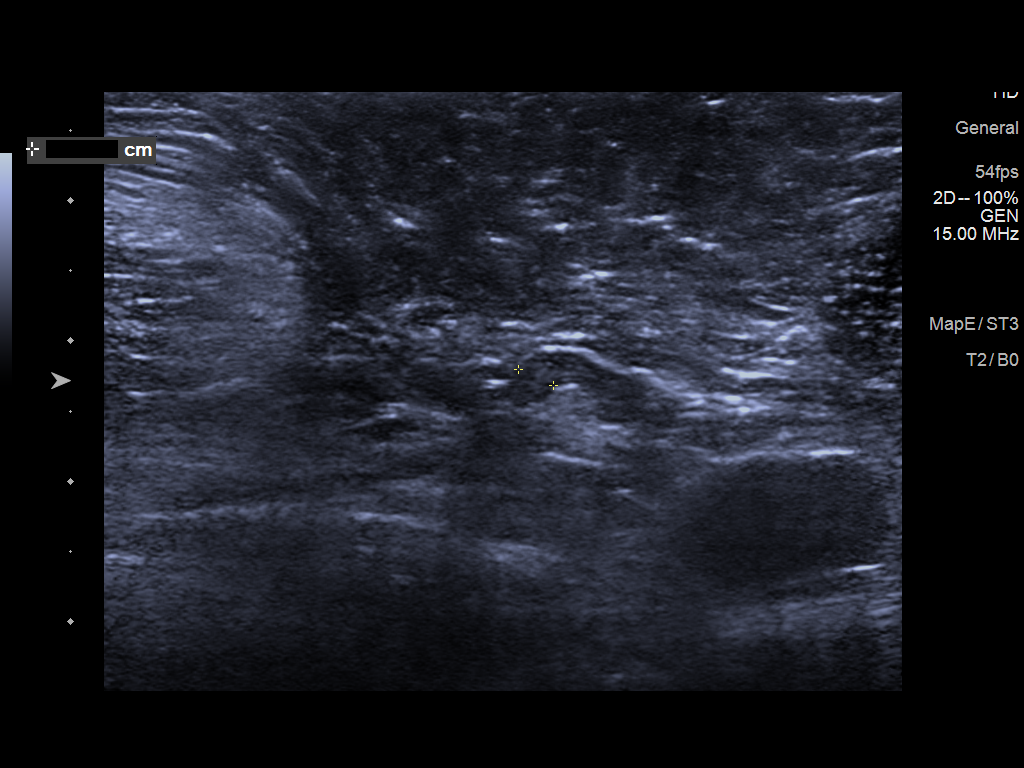
[im 3/5]
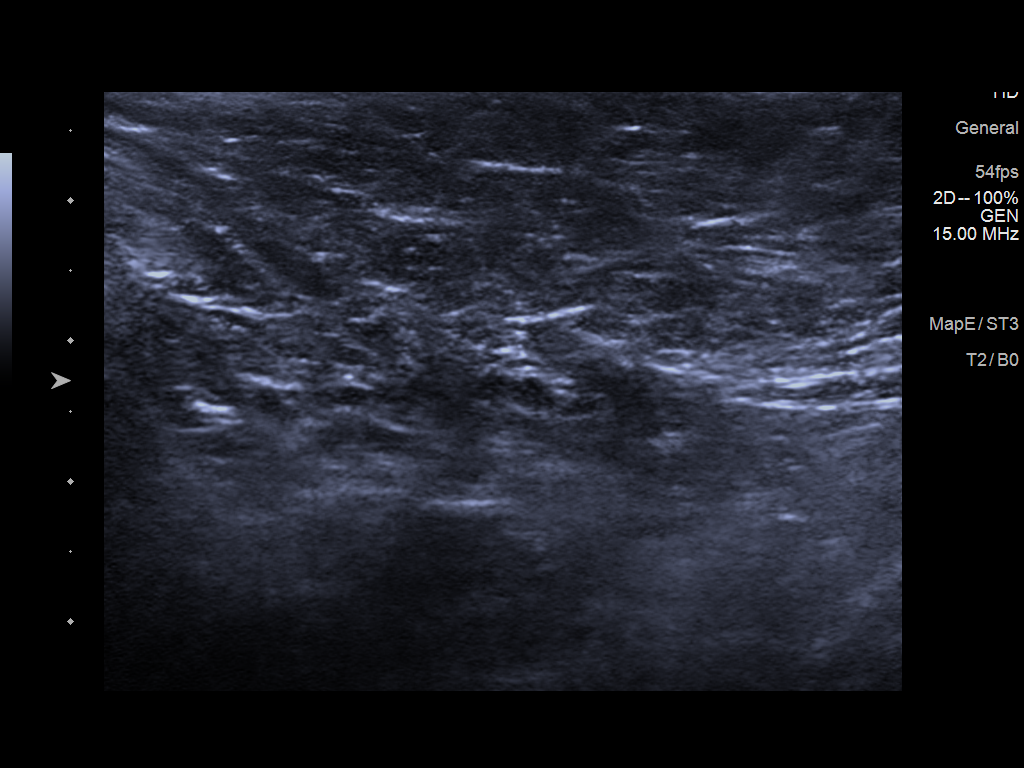
[im 4/5]
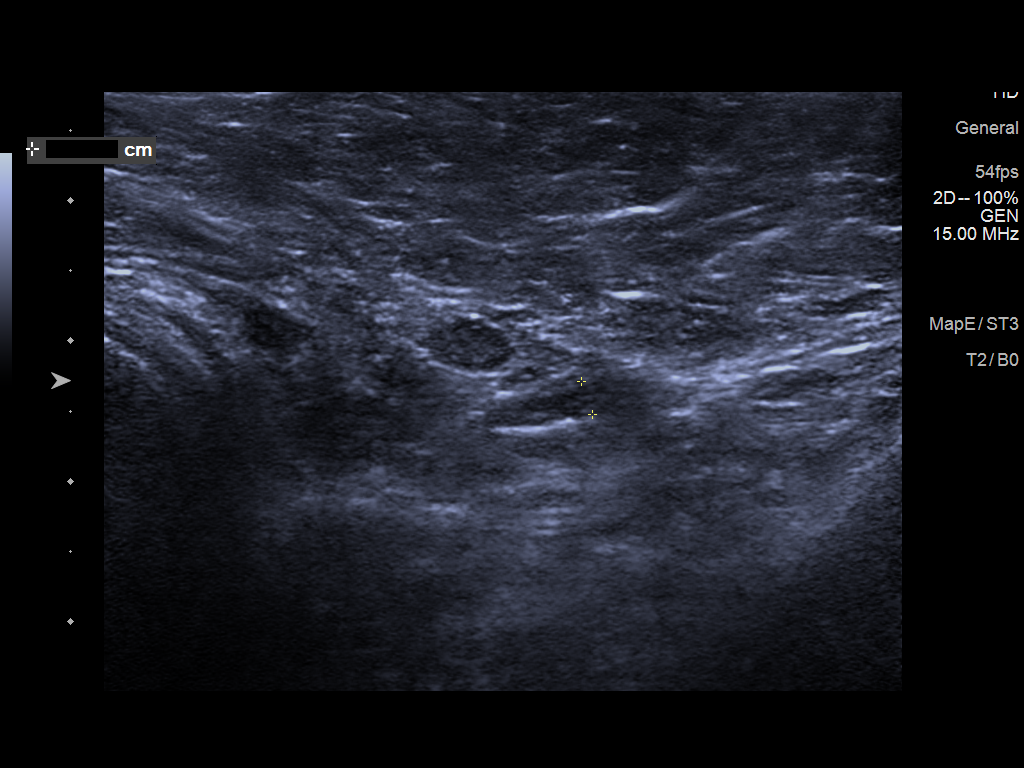
[im 5/5]
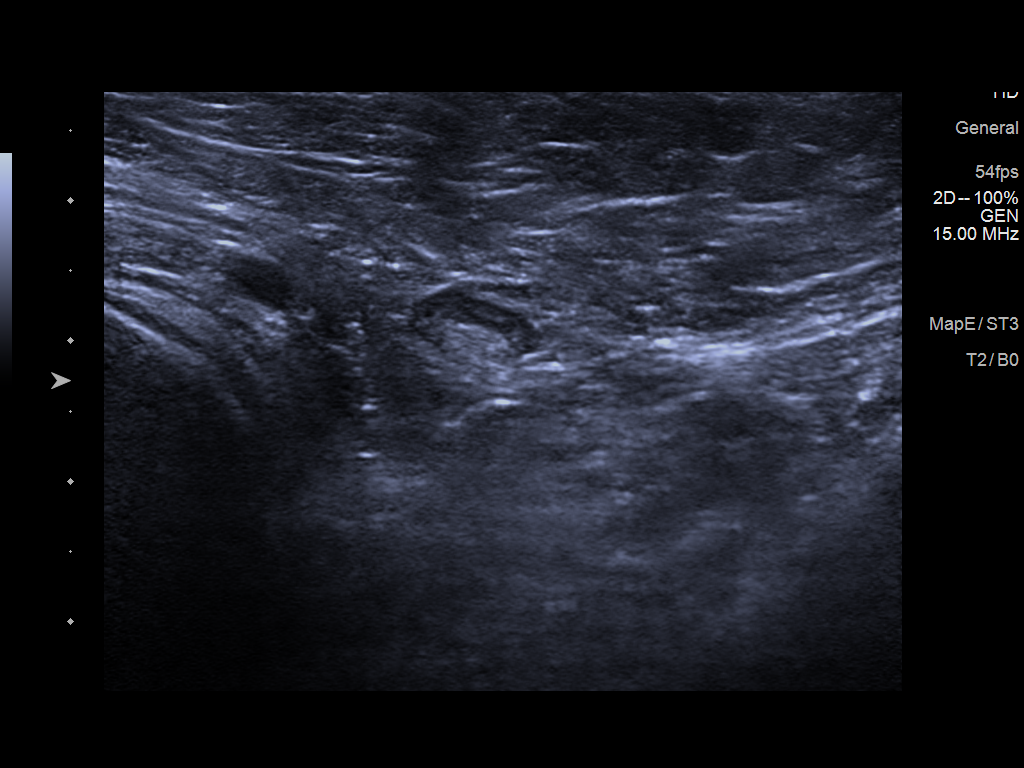

[5 of 5 positions shown; findings below may reference images not displayed]

ACR Breast Density Category b: There are scattered areas of
fibroglandular density.
FINDINGS: No concerning masses, calcifications or nonsurgical distortion
identified within the right or left breast.

Mammographic images were processed with CAD.

On physical exam, no discrete right axillary mass is palpated.

Targeted ultrasound is performed, showing normal tissue without
suspicious mass within the right axilla.
IMPRESSION: No mammographic evidence for malignancy.

No suspicious abnormality within the right axilla.

RECOMMENDATION:
Screening mammogram in one year.(Code:H2-6-Z5P).

Continued clinical evaluation for right axillary tenderness.

I have discussed the findings and recommendations with the patient.
If applicable, a reminder letter will be sent to the patient
regarding the next appointment.

BI-RADS CATEGORY  2: Benign.

## 2020-07-23 IMAGING — MG DIGITAL DIAGNOSTIC BILAT W/ TOMO W/ CAD
8 of 15 series · 9 of 40 positions shown · non-contrast
Comparison: Previous exam(s).

CLINICAL DATA: Patient presents for evaluation of right axillary
palpable area of concern.

EXAM:
DIGITAL DIAGNOSTIC BILATERAL MAMMOGRAM WITH CAD AND TOMO
ULTRASOUND RIGHT BREAST

[L MLO synth-2D (1 of 2)]
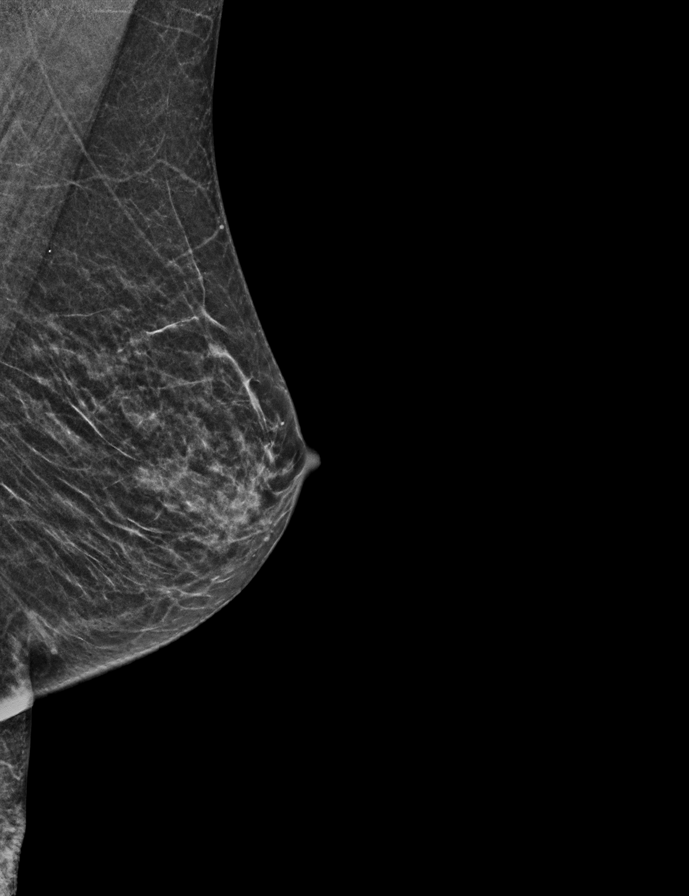

[R MLO synth-2D (1 of 2)]
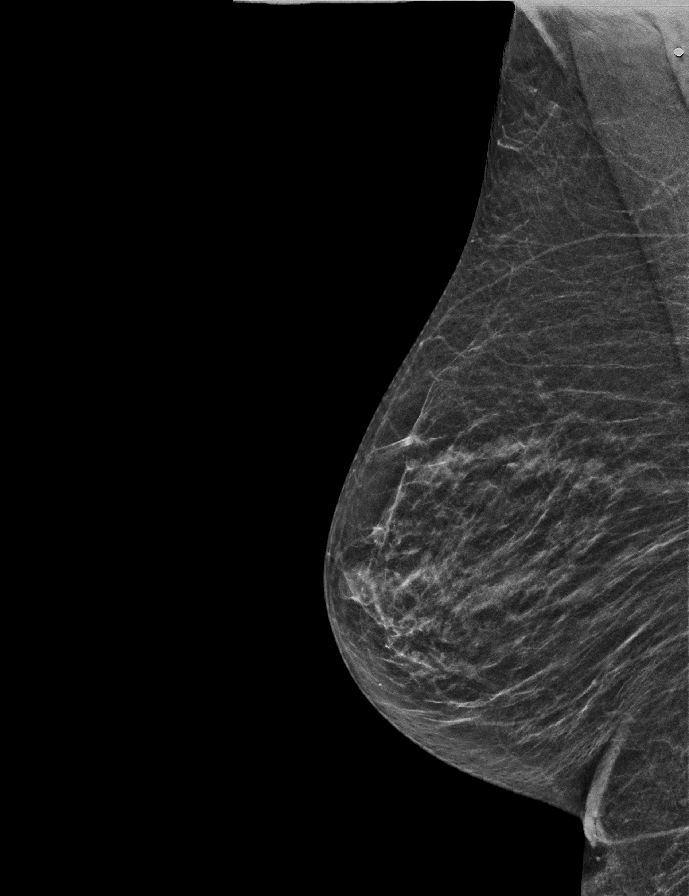

[L XCCL synth-2D]
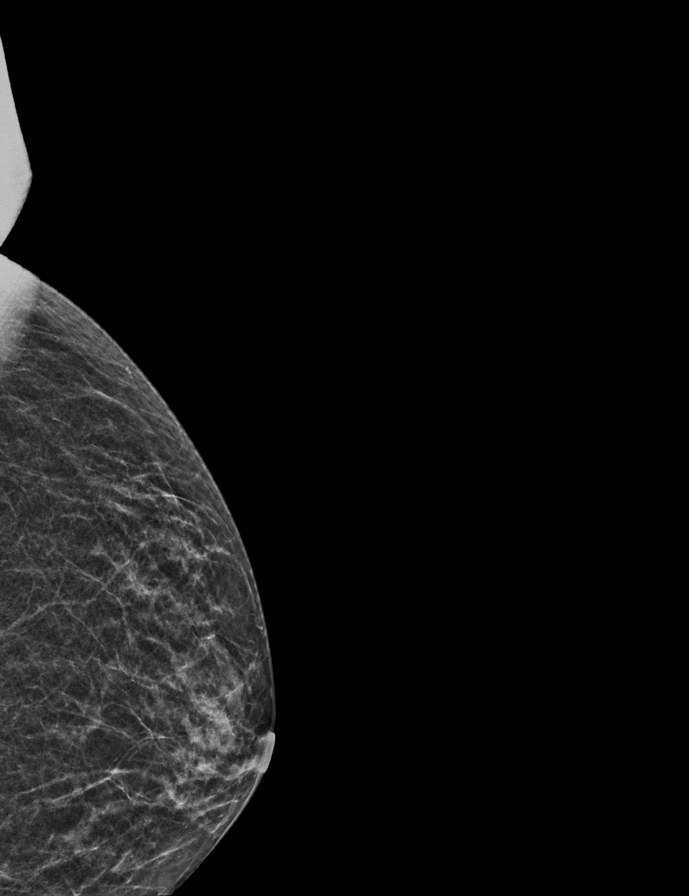

[L MLO synth-2D (2 of 2)]
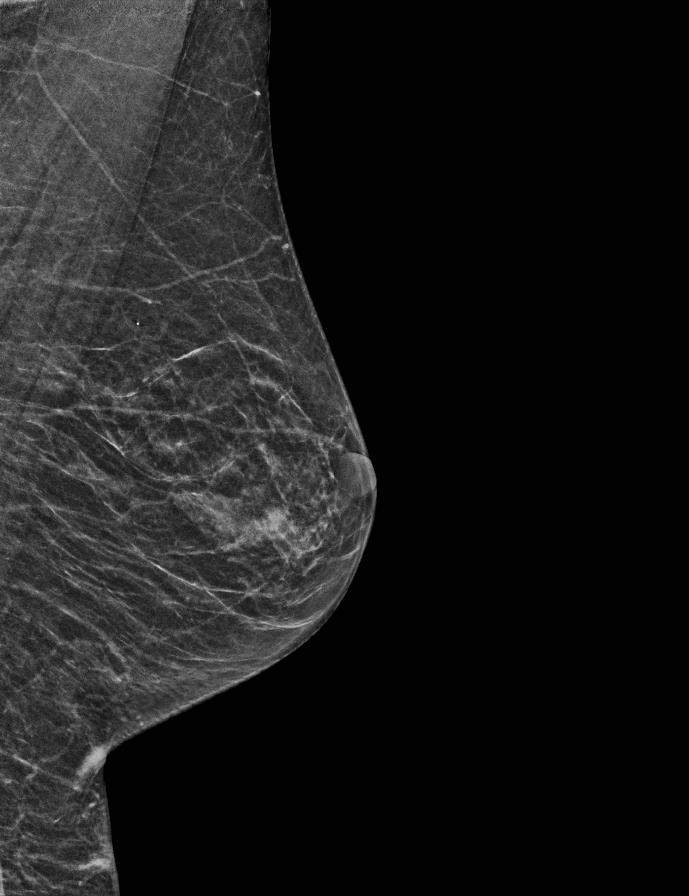

[R MLO synth-2D (2 of 2)]
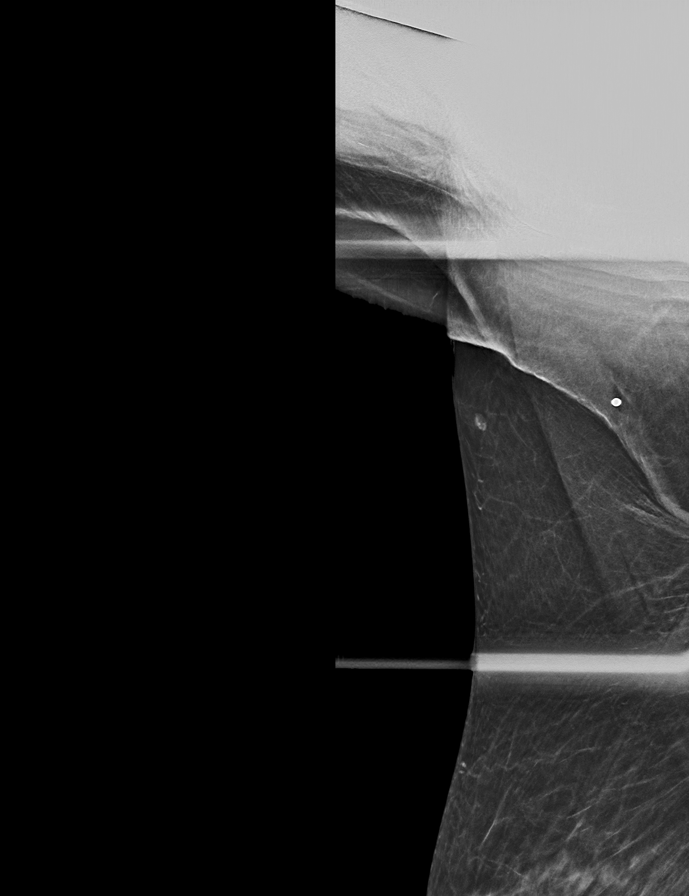

[L CC synth-2D]
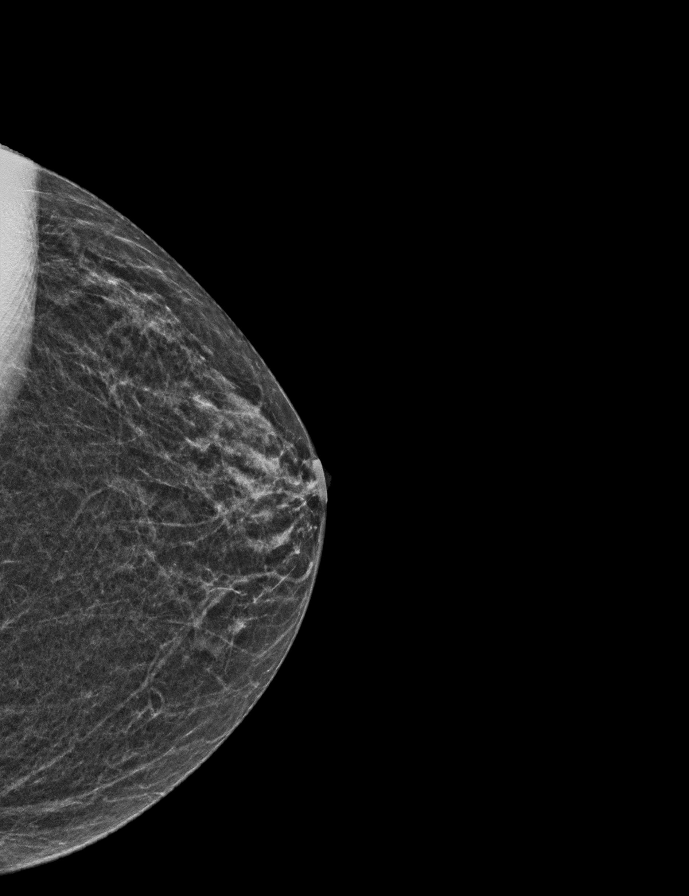

[R CC synth-2D]
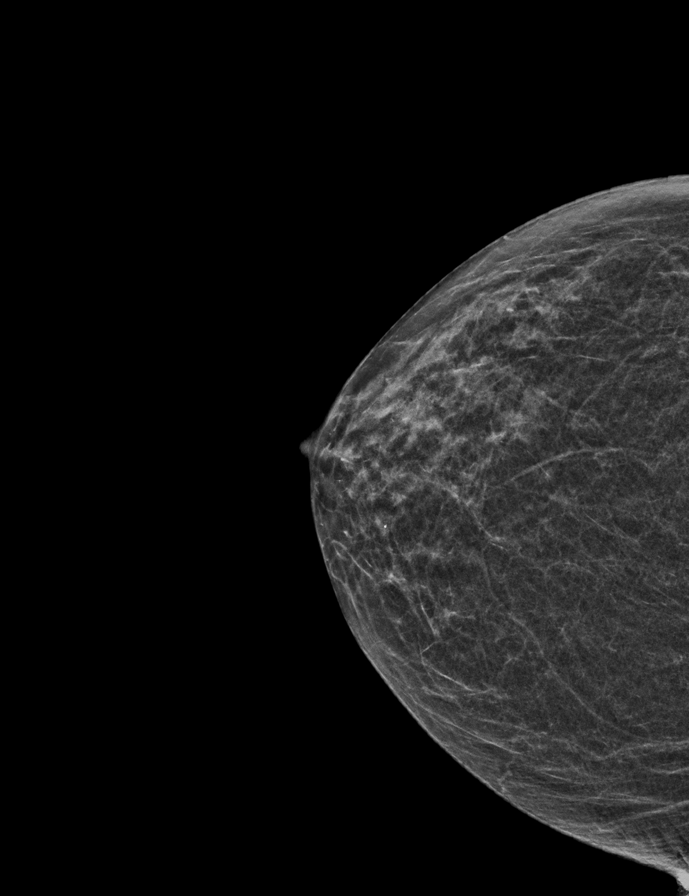

[L XCCL tomo · 2 of 35 frames shown]
[frame 18/35]
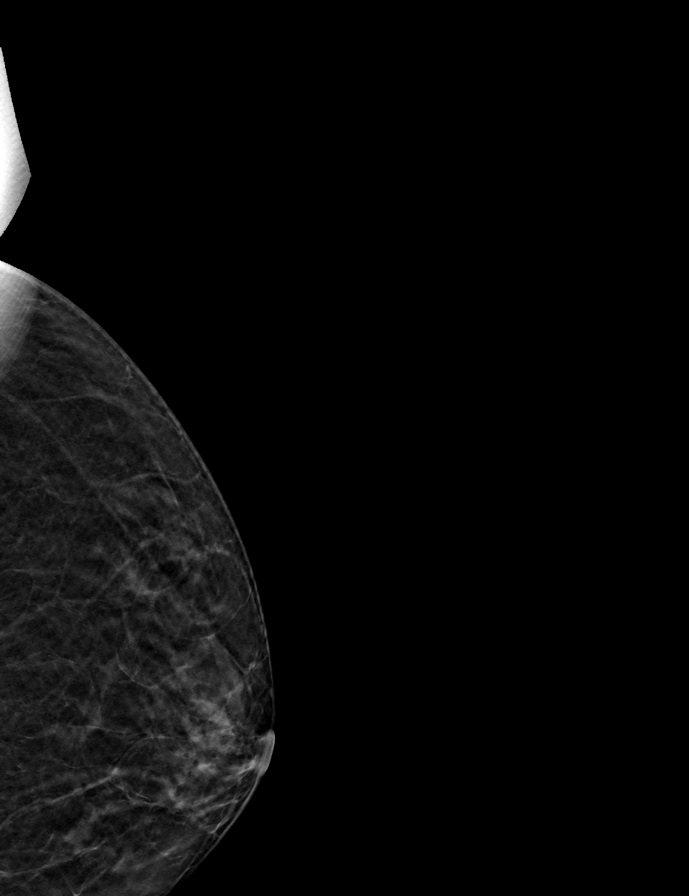
[frame 24/35]
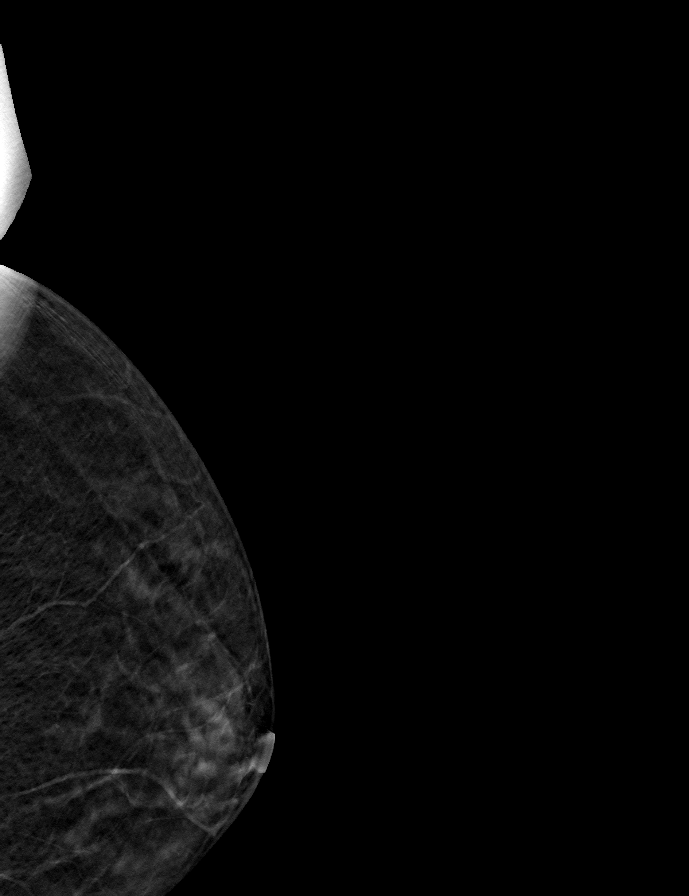

[9 of 40 positions shown; findings below may reference images not displayed]

ACR Breast Density Category b: There are scattered areas of
fibroglandular density.
FINDINGS: No concerning masses, calcifications or nonsurgical distortion
identified within the right or left breast.

Mammographic images were processed with CAD.

On physical exam, no discrete right axillary mass is palpated.

Targeted ultrasound is performed, showing normal tissue without
suspicious mass within the right axilla.
IMPRESSION: No mammographic evidence for malignancy.

No suspicious abnormality within the right axilla.

RECOMMENDATION:
Screening mammogram in one year.(Code:H2-6-Z5P).

Continued clinical evaluation for right axillary tenderness.

I have discussed the findings and recommendations with the patient.
If applicable, a reminder letter will be sent to the patient
regarding the next appointment.

BI-RADS CATEGORY  2: Benign.

## 2020-07-23 MED ORDER — PREDNISONE 10 MG PO TABS: ORAL_TABLET | ORAL | 0 refills | Status: DC

## 2020-07-23 NOTE — Patient Instructions (Signed)
Namenda - alternative to aricept

## 2020-07-27 ENCOUNTER — Other Ambulatory Visit: Payer: Self-pay | Admitting: Physician Assistant

## 2020-07-27 DIAGNOSIS — R21 Rash and other nonspecific skin eruption: Secondary | ICD-10-CM

## 2020-07-27 NOTE — Telephone Encounter (Signed)
[facility] refill request for the following medications:  predniSONE (DELTASONE) 10 MG tablet  Last Rx: 07/23/2020 LOV: 07/23/2020  Pt stated that the rash has improved but she thinks another round of the medication would help a lot. Please advise. Thanks TNP

## 2020-07-28 MED ORDER — PREDNISONE 10 MG PO TABS
ORAL_TABLET | ORAL | 0 refills | Status: DC
Start: 2020-07-28 — End: 2021-09-17

## 2020-07-28 MED ORDER — PREDNISONE 10 MG PO TABS: ORAL_TABLET | ORAL | 0 refills | Status: DC

## 2020-07-28 NOTE — Telephone Encounter (Signed)
Please Review

## 2020-07-28 NOTE — Addendum Note (Signed)
Addended by: Marjie Skiff on: 07/28/2020 11:07 AM   Modules accepted: Orders

## 2020-07-28 NOTE — Telephone Encounter (Signed)
Happy to hear she is improving. I have sent in another round of prednisone.

## 2020-08-03 NOTE — Progress Notes (Signed)
This encounter was created in error - please disregard.

## 2020-08-04 ENCOUNTER — Other Ambulatory Visit: Payer: Self-pay

## 2020-08-12 NOTE — Progress Notes (Signed)
Subjective:   Michele Lawrence is a 74 y.o. female who presents for Medicare Annual (Subsequent) preventive examination.  I connected with Michele Lawrence today by telephone and verified that I am speaking with the correct person using two identifiers. Location patient: home Location provider: work Persons participating in the virtual visit: patient, provider.   I discussed the limitations, risks, security and privacy concerns of performing an evaluation and management service by telephone and the availability of in person appointments. I also discussed with the patient that there may be a patient responsible charge related to this service. The patient expressed understanding and verbally consented to this telephonic visit.    Interactive audio and video telecommunications were attempted between this provider and patient, however failed, due to patient having technical difficulties OR patient did not have access to video capability.  We continued and completed visit with audio only.   Review of Systems    N/A  Cardiac Risk Factors include: advanced age (>22men, >35 women)     Objective:    There were no vitals filed for this visit. There is no height or weight on file to calculate BMI.  Advanced Directives 08/13/2020 08/25/2016 06/29/2016 06/13/2016 02/17/2016 08/19/2015  Does Patient Have a Medical Advance Directive? Yes Yes Yes No Yes Yes  Type of Estate agent of Michele Lawrence;Living will Healthcare Power of Michele Lawrence;Living will - - Living will;Healthcare Power of Attorney Living will  Copy of Healthcare Power of Attorney in Chart? No - copy requested - - - - -    Current Medications (verified) Outpatient Encounter Medications as of 08/13/2020  Medication Sig  . acetaminophen (TYLENOL) 500 MG tablet Take 500 mg by mouth every 6 (six) hours as needed for moderate pain or headache. Reported on 03/18/2016  . aspirin 81 MG tablet Take 81 mg by mouth daily.   Marland Kitchen  donepezil (ARICEPT) 10 MG tablet Take 10 mg by mouth at bedtime.   Marland Kitchen ibuprofen (ADVIL) 200 MG tablet Take 200 mg by mouth as needed. Reported on 03/18/2016  . ALPRAZolam (XANAX) 0.25 MG tablet Take 1 tab PO 1 hour prior to procedure. May repeat at time of procedure if needed (Patient not taking: Reported on 03/30/2020)  . Apoaequorin (PREVAGEN PO) Take by mouth daily. (Patient not taking: Reported on 08/13/2020)  . baclofen (LIORESAL) 10 MG tablet TAKE ONE TABLET BY MOUTH THREE TIMES A DAY (Patient not taking: Reported on 07/23/2020)  . meloxicam (MOBIC) 15 MG tablet Take 1 tablet (15 mg total) by mouth daily. (Patient not taking: Reported on 07/23/2020)  . MULTIPLE VITAMINS-MINERALS PO Take 1 tablet by mouth daily. (Patient not taking: Reported on 08/13/2020)  . predniSONE (DELTASONE) 10 MG tablet Take 6 pills on day 1, 5 pills on day 2 and so on until complete. (Patient not taking: Reported on 08/13/2020)   No facility-administered encounter medications on file as of 08/13/2020.    Allergies (verified) Penicillins   History: Past Medical History:  Diagnosis Date  . Asthma   . Stroke [facility])    Past Surgical History:  Procedure Laterality Date  . APPENDECTOMY    . TONSILLECTOMY     Family History  Problem Relation Age of Onset  . Cancer Mother        breast  . Diabetes Mother   . Stroke Mother   . CVA Mother   . Cataracts Mother   . Psoriasis Mother   . Breast cancer Mother 58  . Alcohol abuse Father   .  Heart disease Sister    Social History   Socioeconomic History  . Marital status: Married    Spouse name: Not on file  . Number of children: 4  . Years of education: Not on file  . Highest education level: Some college, no degree  Occupational History  . Occupation: retired  Tobacco Use  . Smoking status: Never Smoker  . Smokeless tobacco: Never Used  Substance and Sexual Activity  . Alcohol use: Not Currently  . Drug use: No  . Sexual activity: Not on file  Other  Topics Concern  . Not on file  Social History Narrative  . Not on file   Social Determinants of Health   Financial Resource Strain: Low Risk   . Difficulty of Paying Living Expenses: Not hard at all  Food Insecurity: No Food Insecurity  . Worried About Programme researcher, broadcasting/film/video in the Last Year: Never true  . Ran Out of Food in the Last Year: Never true  Transportation Needs: No Transportation Needs  . Lack of Transportation (Medical): No  . Lack of Transportation (Non-Medical): No  Physical Activity: Inactive  . Days of Exercise per Week: 0 days  . Minutes of Exercise per Session: 0 min  Stress: No Stress Concern Present  . Feeling of Stress : Not at all  Social Connections: Moderately Integrated  . Frequency of Communication with Friends and Family: Three times a week  . Frequency of Social Gatherings with Friends and Family: More than three times a week  . Attends Religious Services: 1 to 4 times per year  . Active Member of Clubs or Organizations: No  . Attends Banker Meetings: Never  . Marital Status: Married    Tobacco Counseling Counseling given: Not Answered   Clinical Intake:  Pre-visit preparation completed: Yes  Pain : No/denies pain     Nutritional Risks: None Diabetes: No  How often do you need to have someone help you when you read instructions, pamphlets, or other written materials from your doctor or pharmacy?: 1 - Never  Diabetic? No  Interpreter Needed?: No  Information entered by :: [facility], LPN   Activities of Daily Living In your present state of health, do you have any difficulty performing the following activities: 08/13/2020 07/23/2020  Hearing? N N  Vision? N N  Difficulty concentrating or making decisions? Y Y  Comment Currently on Aricept. -  Walking or climbing stairs? N N  Dressing or bathing? N N  Doing errands, shopping? N N  Preparing Food and eating ? N -  Using the Toilet? N -  In the past six months, have you  accidently leaked urine? N -  Do you have problems with loss of bowel control? N -  Managing your Medications? Y -  Comment Husband assists with medications. -  Managing your Finances? Y -  Comment Husband assists with finances. -  Housekeeping or managing your Housekeeping? N -  Some recent data might be hidden    Patient Care Team: Erasmo Downer, MD as PCP - General (Family Medicine)  Indicate any recent Medical Services you may have received from other than [facility] providers in the past year (date may be approximate).     Assessment:   This is a routine wellness examination for [facility].  Hearing/Vision screen No exam data present  Dietary issues and exercise activities discussed: Current Exercise Habits: The patient does not participate in regular exercise at present, Exercise limited by: None identified  Goals    .  DIET - INCREASE WATER INTAKE     Recommend to drink at least 6-8 8oz glasses of water per day.      Depression Screen PHQ 2/9 Scores 08/13/2020 07/23/2020 03/02/2020 09/27/2017 08/25/2016 02/17/2016  PHQ - 2 Score 0 0 0 0 0 0  PHQ- 9 Score - 0 3 0 - -    Fall Risk Fall Risk  08/13/2020 07/23/2020 03/02/2020 06/10/2019 09/27/2017  Falls in the past year? 0 0 0 (No Data) No  Comment - - - Emmi Telephone Survey: data to providers prior to load -  Number falls in past yr: 0 0 0 (No Data) -  Comment - - - Emmi Telephone Survey Actual Response =  -  Injury with Fall? 0 0 0 - -  Risk for fall due to : - No Fall Risks No Fall Risks - -  Follow up - Falls evaluation completed Falls evaluation completed - -    Any stairs in or around the home? Yes  If so, are there any without handrails? No  Home free of loose throw rugs in walkways, pet beds, electrical cords, etc? Yes  Adequate lighting in your home to reduce risk of falls? Yes   ASSISTIVE DEVICES UTILIZED TO PREVENT FALLS:  Life alert? No  Use of a cane, walker or w/c? No  Grab bars in the bathroom? Yes    Shower chair or bench in shower? Yes  Elevated toilet seat or a handicapped toilet? No    Cognitive Function: MMSE - Mini Mental State Exam 03/02/2020  Orientation to time 1  Orientation to Place 3  Registration 3  Attention/ Calculation 4  Recall 2  Language- name 2 objects 2  Language- repeat 1  Language- follow 3 step command 3  Language- read & follow direction 1  Write a sentence 1  Copy design 0  Total score 21     6CIT Screen 03/02/2020 10/20/2016  What Year? 4 points 0 points  What month? 0 points 0 points  What time? 0 points 0 points  Count back from 20 4 points 0 points  Months in reverse 4 points 0 points  Repeat phrase 8 points 2 points  Total Score 20 2    Immunizations Immunization History  Administered Date(s) Administered  . Influenza, High Dose Seasonal PF 07/19/2018  . Moderna SARS-COVID-2 Vaccination 11/17/2019, 12/21/2019  . Pneumococcal Conjugate-13 04/18/2018  . Pneumococcal Polysaccharide-23 02/18/2010  . Td 12/05/2011  . Tdap 12/05/2011  . Zoster 08/24/2010    TDAP status: Up to date Flu Vaccine status: Declined, Education has been provided regarding the importance of this vaccine but patient still declined. Advised may receive this vaccine at local pharmacy or Health Dept. Aware to provide a copy of the vaccination record if obtained from local pharmacy or Health Dept. Verbalized acceptance and understanding. Pneumococcal vaccine status: Up to date Covid-19 vaccine status: Completed vaccines  Qualifies for Shingles Vaccine? Yes   Zostavax completed Yes   Shingrix Completed?: No.    Education has been provided regarding the importance of this vaccine. Patient has been advised to call insurance company to determine out of pocket expense if they have not yet received this vaccine. Advised may also receive vaccine at local pharmacy or Health Dept. Verbalized acceptance and understanding.  Screening Tests Health Maintenance  Topic Date Due  .  INFLUENZA VACCINE  06/07/2020  . DEXA SCAN  08/13/2021 (Originally 10/26/2009)  . COLONOSCOPY  08/13/2021 (Originally 10/26/1994)  . TETANUS/TDAP  12/04/2021  .  COVID-19 Vaccine  Completed  . Hepatitis C Screening  Completed  . PNA vac Low Risk Adult  Completed    Health Maintenance  Health Maintenance Due  Topic Date Due  . INFLUENZA VACCINE  06/07/2020    Colorectal cancer screening: Currently due, declined colonoscopy referral or cologuard order at this time.  Mammogram status: No longer required.  Bone Density status: Currently due. Declined order at this time.   Lung Cancer Screening: (Low Dose CT Chest recommended if Age 56-80 years, 30 pack-year currently smoking OR have quit w/in 15years.) does not qualify.   Additional Screening:  Hepatitis C Screening: Up to date  Vision Screening: Recommended annual ophthalmology exams for early detection of glaucoma and other disorders of the eye. Is the patient up to date with their annual eye exam? No Who is the provider or what is the name of the office in which the patient attends annual eye exams? N/A If pt is not established with a provider, would they like to be referred to a provider to establish care? No, declined.  Dental Screening: Recommended annual dental exams for proper oral hygiene  Community Resource Referral / Chronic Care Management: CRR required this visit?  No   CCM required this visit?  No      Plan:     I have personally reviewed and noted the following in the patient's chart:   . Medical and social history . Use of alcohol, tobacco or illicit drugs  . Current medications and supplements . Functional ability and status . Nutritional status . Physical activity . Advanced directives . List of other physicians . Hospitalizations, surgeries, and ER visits in previous 12 months . Vitals . Screenings to include cognitive, depression, and falls . Referrals and appointments  In addition, I have  reviewed and discussed with patient certain preventive protocols, quality metrics, and best practice recommendations. A written personalized care plan for preventive services as well as general preventive health recommendations were provided to patient.     Hedaya Latendresse [place], [place]   [id number]/1/096010/05/2020   Nurse Notes: Pt to receive a flu shot at the pharmacy this fall. Declined a colonoscopy referral, cologuard order and DEXA scan order at this time.

## 2020-08-13 ENCOUNTER — Ambulatory Visit (INDEPENDENT_AMBULATORY_CARE_PROVIDER_SITE_OTHER): Payer: Medicare Other

## 2020-08-13 ENCOUNTER — Other Ambulatory Visit: Payer: Self-pay

## 2020-08-13 DIAGNOSIS — Z0000 Encounter for general adult medical examination without abnormal findings: Secondary | ICD-10-CM

## 2020-08-13 NOTE — Patient Instructions (Signed)
Michele Lawrence , Thank you for taking time to come for your Medicare Wellness Visit. I appreciate your ongoing commitment to your health goals. Please review the following plan we discussed and let me know if I can assist you in the future.   Screening recommendations/referrals: Colonoscopy: Currently due, declined a colonoscopy referral or cologuard order at this time.  Mammogram: Up to date, due 03/2021 Bone Density: Currently due, declined DEXA order today.  Recommended yearly ophthalmology/optometry visit for glaucoma screening and checkup Recommended yearly dental visit for hygiene and checkup  Vaccinations: Influenza vaccine: Currently due, will receive at pharmacy this fall. Pneumococcal vaccine: Completed series Tdap vaccine: Up to date, due 11/2021 Shingles vaccine: Shingrix discussed. Please contact your pharmacy for coverage information.     Advanced directives: Please bring a copy of your POA (Power of Attorney) and/or Living Will to your next appointment.   Conditions/risks identified: Recommend to drink at least 6-8 8oz glasses of water per day.  Next appointment: None, declined scheduling a follow up with PCP or an AWV for 2022 at this time.    Preventive Care 2 Years and Older, Female Preventive care refers to lifestyle choices and visits with your health care provider that can promote health and wellness. What does preventive care include?  A yearly physical exam. This is also called an annual well check.  Dental exams once or twice a year.  Routine eye exams. Ask your health care provider how often you should have your eyes checked.  Personal lifestyle choices, including:  Daily care of your teeth and gums.  Regular physical activity.  Eating a healthy diet.  Avoiding tobacco and drug use.  Limiting alcohol use.  Practicing safe sex.  Taking low-dose aspirin every day.  Taking vitamin and mineral supplements as recommended by your health care  provider. What happens during an annual well check? The services and screenings done by your health care provider during your annual well check will depend on your age, overall health, lifestyle risk factors, and family history of disease. Counseling  Your health care provider may ask you questions about your:  Alcohol use.  Tobacco use.  Drug use.  Emotional well-being.  Home and relationship well-being.  Sexual activity.  Eating habits.  History of falls.  Memory and ability to understand (cognition).  Work and work Astronomer.  Reproductive health. Screening  You may have the following tests or measurements:  Height, weight, and BMI.  Blood pressure.  Lipid and cholesterol levels. These may be checked every 5 years, or more frequently if you are over 75 years old.  Skin check.  Lung cancer screening. You may have this screening every year starting at age 26 if you have a 30-pack-year history of smoking and currently smoke or have quit within the past 15 years.  Fecal occult blood test (FOBT) of the stool. You may have this test every year starting at age 70.  Flexible sigmoidoscopy or colonoscopy. You may have a sigmoidoscopy every 5 years or a colonoscopy every 10 years starting at age 49.  Hepatitis C blood test.  Hepatitis B blood test.  Sexually transmitted disease (STD) testing.  Diabetes screening. This is done by checking your blood sugar (glucose) after you have not eaten for a while (fasting). You may have this done every 1-3 years.  Bone density scan. This is done to screen for osteoporosis. You may have this done starting at age 62.  Mammogram. This may be done every 1-2 years. Talk to  your health care provider about how often you should have regular mammograms. Talk with your health care provider about your test results, treatment options, and if necessary, the need for more tests. Vaccines  Your health care provider may recommend certain  vaccines, such as:  Influenza vaccine. This is recommended every year.  Tetanus, diphtheria, and acellular pertussis (Tdap, Td) vaccine. You may need a Td booster every 10 years.  Zoster vaccine. You may need this after age 48.  Pneumococcal 13-valent conjugate (PCV13) vaccine. One dose is recommended after age 87.  Pneumococcal polysaccharide (PPSV23) vaccine. One dose is recommended after age 27. Talk to your health care provider about which screenings and vaccines you need and how often you need them. This information is not intended to replace advice given to you by your health care provider. Make sure you discuss any questions you have with your health care provider. Document Released: 11/20/2015 Document Revised: 07/13/2016 Document Reviewed: 08/25/2015 Elsevier Interactive Patient Education  2017 [place] Prevention in the Home Falls can cause injuries. They can happen to people of all ages. There are many things you can do to make your home safe and to help prevent falls. What can I do on the outside of my home?  Regularly fix the edges of walkways and driveways and fix any cracks.  Remove anything that might make you trip as you walk through a door, such as a raised step or threshold.  Trim any bushes or trees on the path to your home.  Use bright outdoor lighting.  Clear any walking paths of anything that might make someone trip, such as rocks or tools.  Regularly check to see if handrails are loose or broken. Make sure that both sides of any steps have handrails.  Any raised decks and porches should have guardrails on the edges.  Have any leaves, snow, or ice cleared regularly.  Use sand or salt on walking paths during winter.  Clean up any spills in your garage right away. This includes oil or grease spills. What can I do in the bathroom?  Use night lights.  Install grab bars by the toilet and in the tub and shower. Do not use towel bars as grab  bars.  Use non-skid mats or decals in the tub or shower.  If you need to sit down in the shower, use a plastic, non-slip stool.  Keep the floor dry. Clean up any water that spills on the floor as soon as it happens.  Remove soap buildup in the tub or shower regularly.  Attach bath mats securely with double-sided non-slip rug tape.  Do not have throw rugs and other things on the floor that can make you trip. What can I do in the bedroom?  Use night lights.  Make sure that you have a light by your bed that is easy to reach.  Do not use any sheets or blankets that are too big for your bed. They should not hang down onto the floor.  Have a firm chair that has side arms. You can use this for support while you get dressed.  Do not have throw rugs and other things on the floor that can make you trip. What can I do in the kitchen?  Clean up any spills right away.  Avoid walking on wet floors.  Keep items that you use a lot in easy-to-reach places.  If you need to reach something above you, use a strong step stool that has  a grab bar.  Keep electrical cords out of the way.  Do not use floor polish or wax that makes floors slippery. If you must use wax, use non-skid floor wax.  Do not have throw rugs and other things on the floor that can make you trip. What can I do with my stairs?  Do not leave any items on the stairs.  Make sure that there are handrails on both sides of the stairs and use them. Fix handrails that are broken or loose. Make sure that handrails are as long as the stairways.  Check any carpeting to make sure that it is firmly attached to the stairs. Fix any carpet that is loose or worn.  Avoid having throw rugs at the top or bottom of the stairs. If you do have throw rugs, attach them to the floor with carpet tape.  Make sure that you have a light switch at the top of the stairs and the bottom of the stairs. If you do not have them, ask someone to add them for  you. What else can I do to help prevent falls?  Wear shoes that:  Do not have high heels.  Have rubber bottoms.  Are comfortable and fit you well.  Are closed at the toe. Do not wear sandals.  If you use a stepladder:  Make sure that it is fully opened. Do not climb a closed stepladder.  Make sure that both sides of the stepladder are locked into place.  Ask someone to hold it for you, if possible.  Clearly mark and make sure that you can see:  Any grab bars or handrails.  First and last steps.  Where the edge of each step is.  Use tools that help you move around (mobility aids) if they are needed. These include:  Canes.  Walkers.  Scooters.  Crutches.  Turn on the lights when you go into a dark area. Replace any light bulbs as soon as they burn out.  Set up your furniture so you have a clear path. Avoid moving your furniture around.  If any of your floors are uneven, fix them.  If there are any pets around you, be aware of where they are.  Review your medicines with your doctor. Some medicines can make you feel dizzy. This can increase your chance of falling. Ask your doctor what other things that you can do to help prevent falls. This information is not intended to replace advice given to you by your health care provider. Make sure you discuss any questions you have with your health care provider. Document Released: 08/20/2009 Document Revised: 03/31/2016 Document Reviewed: 11/28/2014 Elsevier Interactive Patient Education  2017 [employer].

## 2020-09-02 DIAGNOSIS — Z23 Encounter for immunization: Secondary | ICD-10-CM | POA: Diagnosis not present

## 2020-09-07 DIAGNOSIS — G301 Alzheimer's disease with late onset: Secondary | ICD-10-CM | POA: Diagnosis not present

## 2020-09-07 DIAGNOSIS — F0280 Dementia in other diseases classified elsewhere without behavioral disturbance: Secondary | ICD-10-CM | POA: Diagnosis not present

## 2020-09-07 DIAGNOSIS — I10 Essential (primary) hypertension: Secondary | ICD-10-CM | POA: Diagnosis not present

## 2021-08-30 DIAGNOSIS — Z23 Encounter for immunization: Secondary | ICD-10-CM | POA: Diagnosis not present

## 2021-09-13 DIAGNOSIS — F0280 Dementia in other diseases classified elsewhere without behavioral disturbance: Secondary | ICD-10-CM | POA: Diagnosis not present

## 2021-09-13 DIAGNOSIS — G301 Alzheimer's disease with late onset: Secondary | ICD-10-CM | POA: Diagnosis not present

## 2021-09-17 ENCOUNTER — Other Ambulatory Visit: Payer: Self-pay

## 2021-09-17 ENCOUNTER — Ambulatory Visit (INDEPENDENT_AMBULATORY_CARE_PROVIDER_SITE_OTHER): Payer: Medicare Other | Admitting: Physician Assistant

## 2021-09-17 ENCOUNTER — Encounter: Payer: Self-pay | Admitting: Physician Assistant

## 2021-09-17 VITALS — BP 141/78 | HR 72 | Temp 98.2°F | Ht 63.0 in | Wt 113.4 lb

## 2021-09-17 DIAGNOSIS — I10 Essential (primary) hypertension: Secondary | ICD-10-CM

## 2021-09-17 DIAGNOSIS — Z8639 Personal history of other endocrine, nutritional and metabolic disease: Secondary | ICD-10-CM | POA: Insufficient documentation

## 2021-09-17 DIAGNOSIS — Z23 Encounter for immunization: Secondary | ICD-10-CM | POA: Diagnosis not present

## 2021-09-17 DIAGNOSIS — E7849 Other hyperlipidemia: Secondary | ICD-10-CM | POA: Diagnosis not present

## 2021-09-17 DIAGNOSIS — L309 Dermatitis, unspecified: Secondary | ICD-10-CM | POA: Insufficient documentation

## 2021-09-17 DIAGNOSIS — F0390 Unspecified dementia without behavioral disturbance: Secondary | ICD-10-CM

## 2021-09-17 DIAGNOSIS — Z8673 Personal history of transient ischemic attack (TIA), and cerebral infarction without residual deficits: Secondary | ICD-10-CM | POA: Insufficient documentation

## 2021-09-17 MED ORDER — TRIAMCINOLONE ACETONIDE 0.5 % EX OINT: 1.0000 "application " | TOPICAL_OINTMENT | Freq: Two times a day (BID) | CUTANEOUS | 2 refills | Status: DC

## 2021-09-17 MED ORDER — AMLODIPINE BESYLATE 2.5 MG PO TABS: 2.5000 mg | ORAL_TABLET | Freq: Every day | ORAL | 3 refills | Status: DC

## 2021-09-17 NOTE — Assessment & Plan Note (Signed)
Not currently medicated.  Advised amlodipine 2.5 mg daily. Start taking BP at home 3-4 times a week. If < 115/60 or feeling lightheaded/dizzy, please call office. F/u 1 mo

## 2021-09-17 NOTE — Progress Notes (Signed)
Established patient visit   [name]   DOB: 05-Sep-1946   75 y.o. Female  MRN: [id number] Visit Date: 09/17/2021  Today's healthcare provider: Alfredia Ferguson, PA-C   Chief Complaint  Patient presents with   Diabetes   Hypertension   Subjective    HPI  Michele Lawrence is a 75 y/o female with a history of dementia who presents today referred by her neurologist due to an elevated BP reading-- 166/75. History of hypertension and stroke. Currently takes 81 mg ASA daily.  Her husband who accompanied her to the visit today, is concerned over her blood sugar as her diet has a large sugar intake--she loves pancakes and syrup, cake, and ice cream. Denies any current, pain, SOB, CP, dizziness, lightheadedness.  ---------------------------------------------------------------------------------------------------     Medications: Outpatient Medications Prior to Visit  Medication Sig   acetaminophen (TYLENOL) 500 MG tablet Take 500 mg by mouth every 6 (six) hours as needed for moderate pain or headache. Reported on 03/18/2016   aspirin 81 MG tablet Take 81 mg by mouth daily.    donepezil (ARICEPT) 10 MG tablet Take 1 tablet by mouth daily.   ibuprofen (ADVIL) 200 MG tablet Take 200 mg by mouth as needed. Reported on 03/18/2016   meloxicam (MOBIC) 15 MG tablet Take 1 tablet (15 mg total) by mouth daily.   MULTIPLE VITAMINS-MINERALS PO Take 1 tablet by mouth daily.   donepezil (ARICEPT) 10 MG tablet Take 10 mg by mouth at bedtime.    [DISCONTINUED] ALPRAZolam (XANAX) 0.25 MG tablet Take 1 tab PO 1 hour prior to procedure. May repeat at time of procedure if needed (Patient not taking: No sig reported)   [DISCONTINUED] Apoaequorin (PREVAGEN PO) Take by mouth daily. (Patient not taking: No sig reported)   [DISCONTINUED] baclofen (LIORESAL) 10 MG tablet TAKE ONE TABLET BY MOUTH THREE TIMES A DAY (Patient not taking: No sig reported)   [DISCONTINUED] predniSONE (DELTASONE) 10 MG tablet  Take 6 pills on day 1, 5 pills on day 2 and so on until complete. (Patient not taking: No sig reported)   No facility-administered medications prior to visit.    Review of Systems  Constitutional:  Negative for fatigue and fever.  Respiratory:  Negative for cough, chest tightness and shortness of breath.   Cardiovascular:  Negative for chest pain.  Gastrointestinal:  Negative for abdominal pain.  All other systems reviewed and are negative.  Last CBC Lab Results  Component Value Date   WBC 4.5 08/14/2018   HGB 15.9 08/14/2018   HCT 47.0 (H) 08/14/2018   MCV 93 08/14/2018   MCH 31.6 08/14/2018   RDW 12.2 (L) 08/14/2018   PLT 173 08/14/2018   Last metabolic panel Lab Results  Component Value Date   GLUCOSE 107 (H) 08/14/2018   NA 142 08/14/2018   K 4.1 08/14/2018   CL 101 08/14/2018   CO2 24 08/14/2018   BUN 17 08/14/2018   CREATININE 0.84 08/14/2018   GFRNONAA 69 08/14/2018   CALCIUM 10.1 08/14/2018   PROT 6.8 08/14/2018   ALBUMIN 4.2 08/14/2018   LABGLOB 2.6 08/14/2018   AGRATIO 1.6 08/14/2018   BILITOT 0.7 08/14/2018   ALKPHOS 137 (H) 08/14/2018   AST 78 (H) 08/14/2018   ALT 57 (H) 08/14/2018   ANIONGAP 6 06/16/2017   Last lipids Lab Results  Component Value Date   CHOL 140 09/27/2017   HDL 52 09/27/2017   LDLCALC 72 09/27/2017   TRIG 76 09/27/2017  CHOLHDL 2.7 09/27/2017   Last hemoglobin A1c Lab Results  Component Value Date   HGBA1C 5.1 09/27/2017   Last thyroid functions Lab Results  Component Value Date   TSH 3.290 08/14/2018   Last vitamin B12 and Folate Lab Results  Component Value Date   VITAMINB12 942 08/14/2018       Objective    Blood pressure (!) 141/78, pulse 72, temperature 98.2 F (36.8 C), temperature source Oral, height 5\' 3"  (1.6 m), weight 113 lb 6.4 oz (51.4 kg), SpO2 98 %.  BP Readings from Last 3 Encounters:  09/17/21 (!) 141/78  07/23/20 136/64  03/30/20 137/84   Wt Readings from Last 3 Encounters:  09/17/21 113  lb 6.4 oz (51.4 kg)  07/23/20 122 lb 9.6 oz (55.6 kg)  03/30/20 125 lb (56.7 kg)      Physical Exam Constitutional:      General: She is awake.     Appearance: She is well-developed.  HENT:     Head: Normocephalic.  Eyes:     Conjunctiva/sclera: Conjunctivae normal.  Cardiovascular:     Rate and Rhythm: Normal rate and regular rhythm.     Heart sounds: Normal heart sounds.  Pulmonary:     Effort: Pulmonary effort is normal.     Breath sounds: Normal breath sounds.  Skin:    General: Skin is warm.  Neurological:     Mental Status: She is alert.     Comments: Oriented to self and place  Psychiatric:        Attention and Perception: Attention normal.        Mood and Affect: Mood normal.        Speech: Speech normal.        Behavior: Behavior is cooperative.      No results found for any visits on 09/17/21.  Assessment & Plan     Problem List Items Addressed This Visit       Cardiovascular and Mediastinum   Primary hypertension - Primary    Not currently medicated.  Advised amlodipine 2.5 mg daily. Start taking BP at home 3-4 times a week. If < 115/60 or feeling lightheaded/dizzy, please call office. F/u 1 mo      Relevant Medications   amLODipine (NORVASC) 2.5 MG tablet   Other Relevant Orders   TSH   Comprehensive Metabolic Panel (CMET)   CBC w/Diff/Platelet     Nervous and Auditory   Dementia without behavioral disturbance (HCC)   Relevant Medications   donepezil (ARICEPT) 10 MG tablet   Other Relevant Orders   CBC w/Diff/Platelet     Musculoskeletal and Integument   Dermatitis    Unknown etiology. On upper back and upper arms. Many stages of healing--small scabbed areas, per husband pt itches and picks at them. Many scarred areas. Advised topical kenalog twice a day, will check at 1 mo f/u to see if there is any improvement.      Relevant Medications   triamcinolone ointment (KENALOG) 0.5 %     Other   HLD (hyperlipidemia)   Relevant  Medications   amLODipine (NORVASC) 2.5 MG tablet   Other Relevant Orders   Lipid panel   Comprehensive Metabolic Panel (CMET)   History of hyperglycemia    Per current diet and history, will check CMP fasting and Hgb A1c today. History of pre-diabetes.  Will also check lipid panel, as a guide to manage exercise/diet.      Relevant Orders   HgB A1c   Comprehensive  Metabolic Panel (CMET)    Return in about 1 month (around 10/17/2021) for hypertension, rash.      I, Alfredia Ferguson, PA-C have reviewed all documentation for this visit. The documentation on  09/17/2021 for the exam, diagnosis, procedures, and orders are all accurate and complete.    Alfredia Ferguson, PA-C  [facility] [phone] (phone) [phone] (fax)  [facility]

## 2021-09-17 NOTE — Assessment & Plan Note (Signed)
Unknown etiology. On upper back and upper arms. Many stages of healing--small scabbed areas, per husband pt itches and picks at them. Many scarred areas. Advised topical kenalog twice a day, will check at 1 mo f/u to see if there is any improvement.

## 2021-09-17 NOTE — Addendum Note (Signed)
Addended by: Lily Kocher on: 09/17/2021 04:45 PM   Modules accepted: Orders

## 2021-09-17 NOTE — Assessment & Plan Note (Signed)
Per current diet and history, will check CMP fasting and Hgb A1c today. History of pre-diabetes.  Will also check lipid panel, as a guide to manage exercise/diet.

## 2021-09-20 DIAGNOSIS — E7849 Other hyperlipidemia: Secondary | ICD-10-CM | POA: Diagnosis not present

## 2021-09-20 DIAGNOSIS — Z8639 Personal history of other endocrine, nutritional and metabolic disease: Secondary | ICD-10-CM | POA: Diagnosis not present

## 2021-09-20 DIAGNOSIS — I10 Essential (primary) hypertension: Secondary | ICD-10-CM | POA: Diagnosis not present

## 2021-09-20 DIAGNOSIS — F0390 Unspecified dementia without behavioral disturbance: Secondary | ICD-10-CM | POA: Diagnosis not present

## 2021-09-20 DIAGNOSIS — E039 Hypothyroidism, unspecified: Secondary | ICD-10-CM | POA: Diagnosis not present

## 2021-09-21 LAB — CBC WITH DIFFERENTIAL/PLATELET
Basophils Absolute: 0.1 10*3/uL (ref 0.0–0.2)
Basos: 2 %
EOS (ABSOLUTE): 0.1 10*3/uL (ref 0.0–0.4)
Eos: 3 %
Hematocrit: 41 % (ref 34.0–46.6)
Hemoglobin: 12.8 g/dL (ref 11.1–15.9)
Immature Grans (Abs): 0 10*3/uL (ref 0.0–0.1)
Immature Granulocytes: 1 %
Lymphocytes Absolute: 1 10*3/uL (ref 0.7–3.1)
Lymphs: 25 %
MCH: 25.5 pg — ABNORMAL LOW (ref 26.6–33.0)
MCHC: 31.2 g/dL — ABNORMAL LOW (ref 31.5–35.7)
MCV: 82 fL (ref 79–97)
Monocytes Absolute: 0.4 10*3/uL (ref 0.1–0.9)
Monocytes: 9 %
Neutrophils Absolute: 2.5 10*3/uL (ref 1.4–7.0)
Neutrophils: 60 %
Platelets: 227 10*3/uL (ref 150–450)
RBC: 5.01 x10E6/uL (ref 3.77–5.28)
RDW: 13.1 % (ref 11.7–15.4)
WBC: 4.1 10*3/uL (ref 3.4–10.8)

## 2021-09-21 LAB — COMPREHENSIVE METABOLIC PANEL
ALT: 9 IU/L (ref 0–32)
AST: 25 IU/L (ref 0–40)
Albumin/Globulin Ratio: 1.8 (ref 1.2–2.2)
Albumin: 4.3 g/dL (ref 3.7–4.7)
Alkaline Phosphatase: 108 IU/L (ref 44–121)
BUN/Creatinine Ratio: 18 (ref 12–28)
BUN: 17 mg/dL (ref 8–27)
Bilirubin Total: 0.5 mg/dL (ref 0.0–1.2)
CO2: 21 mmol/L (ref 20–29)
Calcium: 9.1 mg/dL (ref 8.7–10.3)
Chloride: 108 mmol/L — ABNORMAL HIGH (ref 96–106)
Creatinine, Ser: 0.93 mg/dL (ref 0.57–1.00)
Globulin, Total: 2.4 g/dL (ref 1.5–4.5)
Glucose: 84 mg/dL (ref 70–99)
Potassium: 4.3 mmol/L (ref 3.5–5.2)
Sodium: 144 mmol/L (ref 134–144)
Total Protein: 6.7 g/dL (ref 6.0–8.5)
eGFR: 64 mL/min/{1.73_m2} (ref >59)

## 2021-09-21 LAB — LIPID PANEL
Chol/HDL Ratio: 3.9 ratio (ref 0.0–4.4)
Cholesterol, Total: 211 mg/dL — ABNORMAL HIGH (ref 100–199)
HDL: 54 mg/dL (ref >39)
LDL Chol Calc (NIH): 143 mg/dL — ABNORMAL HIGH (ref 0–99)
Triglycerides: 79 mg/dL (ref 0–149)
VLDL Cholesterol Cal: 14 mg/dL (ref 5–40)

## 2021-09-21 LAB — HEMOGLOBIN A1C
Est. average glucose Bld gHb Est-mCnc: 117 mg/dL
Hgb A1c MFr Bld: 5.7 % — ABNORMAL HIGH (ref 4.8–5.6)

## 2021-09-21 LAB — TSH: TSH: 5.45 u[IU]/mL — ABNORMAL HIGH (ref 0.450–4.500)

## 2021-09-28 LAB — T4, FREE: Free T4: 1.03 ng/dL (ref 0.82–1.77)

## 2021-09-28 LAB — SPECIMEN STATUS REPORT

## 2021-10-19 ENCOUNTER — Encounter: Payer: Self-pay | Admitting: Physician Assistant

## 2021-10-19 ENCOUNTER — Other Ambulatory Visit: Payer: Self-pay

## 2021-10-19 ENCOUNTER — Ambulatory Visit (INDEPENDENT_AMBULATORY_CARE_PROVIDER_SITE_OTHER): Payer: Medicare Other | Admitting: Physician Assistant

## 2021-10-19 VITALS — BP 134/76 | HR 61 | Temp 95.7°F | Ht 62.0 in | Wt 114.2 lb

## 2021-10-19 DIAGNOSIS — L309 Dermatitis, unspecified: Secondary | ICD-10-CM | POA: Diagnosis not present

## 2021-10-19 DIAGNOSIS — I10 Essential (primary) hypertension: Secondary | ICD-10-CM | POA: Diagnosis not present

## 2021-10-19 DIAGNOSIS — E7800 Pure hypercholesterolemia, unspecified: Secondary | ICD-10-CM | POA: Diagnosis not present

## 2021-10-19 MED ORDER — TRIAMCINOLONE ACETONIDE 0.5 % EX OINT: 1.0000 "application " | TOPICAL_OINTMENT | Freq: Two times a day (BID) | CUTANEOUS | 2 refills | Status: DC

## 2021-10-19 NOTE — Assessment & Plan Note (Signed)
Still unknown etiology, but improved with kenalog. Will continue to monitor.

## 2021-10-19 NOTE — Progress Notes (Signed)
Established patient visit   [name]   DOB: [birth year]   75 y.o. Female  MRN: [id number] Visit Date: 10/19/2021  Today's healthcare provider: Mikey Kirschner, PA-C   Cc. HTN f/u   Subjective    HPI  Hypertension, follow-up  BP Readings from Last 3 Encounters:  10/19/21 134/76  09/17/21 (!) 141/78  07/23/20 136/64   Wt Readings from Last 3 Encounters:  10/19/21 114 lb 3.2 oz (51.8 kg)  09/17/21 113 lb 6.4 oz (51.4 kg)  07/23/20 122 lb 9.6 oz (55.6 kg)     She was last seen for hypertension 1 months ago.  BP at that visit was 141/78. Management since that visit includes    amlodipine 2.5 mg daily.   She reports excellent compliance with treatment. Not taking BP at home. She is not having side effects.  She is following a Regular diet. She is not exercising. She does not smoke.  Use of agents associated with hypertension: none.   Outside blood pressures are none. Symptoms: No chest pain No chest pressure  No palpitations No syncope  No dyspnea No orthopnea  No paroxysmal nocturnal dyspnea No lower extremity edema   Pertinent labs: Lab Results  Component Value Date   CHOL 211 (H) 09/20/2021   HDL 54 09/20/2021   LDLCALC 143 (H) 09/20/2021   TRIG 79 09/20/2021   CHOLHDL 3.9 09/20/2021   Lab Results  Component Value Date   NA 144 09/20/2021   K 4.3 09/20/2021   CREATININE 0.93 09/20/2021   EGFR 64 09/20/2021   GLUCOSE 84 09/20/2021   TSH 5.450 (H) 09/20/2021     The 10-year ASCVD risk score (Arnett DK, et al., 2019) is: 19.3%    Pt and husband report her skin rash has improved with the steroid ointment.  --------------------------------------------------------------------------------------- Medications: Outpatient Medications Prior to Visit  Medication Sig   acetaminophen (TYLENOL) 500 MG tablet Take 500 mg by mouth every 6 (six) hours as needed for moderate pain or headache. Reported on 03/18/2016   donepezil (ARICEPT) 10 MG  tablet Take 1 tablet by mouth daily. Take 1 tablet daily with breakfast   memantine (NAMENDA) 10 MG tablet Take 10 mg by mouth daily.   MULTIPLE VITAMINS-MINERALS PO Take 1 tablet by mouth daily.   [DISCONTINUED] triamcinolone ointment (KENALOG) 0.5 % Apply 1 application topically 2 (two) times daily.   amLODipine (NORVASC) 2.5 MG tablet Take 1 tablet (2.5 mg total) by mouth daily.   donepezil (ARICEPT) 10 MG tablet Take 10 mg by mouth at bedtime.    [DISCONTINUED] aspirin 81 MG tablet Take 81 mg by mouth daily.  (Patient not taking: Reported on 10/19/2021)   [DISCONTINUED] ibuprofen (ADVIL) 200 MG tablet Take 200 mg by mouth as needed. Reported on 03/18/2016 (Patient not taking: Reported on 10/19/2021)   [DISCONTINUED] meloxicam (MOBIC) 15 MG tablet Take 1 tablet (15 mg total) by mouth daily. (Patient not taking: Reported on 10/19/2021)   No facility-administered medications prior to visit.    Review of Systems  Constitutional:  Negative for fatigue and fever.  Respiratory:  Negative for cough, chest tightness and shortness of breath.   Cardiovascular:  Negative for chest pain.  Neurological:  Negative for dizziness, syncope and light-headedness.  Psychiatric/Behavioral:  Negative for agitation.       Objective    BP 134/76 Comment: repeat right arm  Pulse 61   Temp (!) 95.7 F (35.4 C) (Oral) Comment: read low  Ht 5'  2" (1.575 m)   Wt 114 lb 3.2 oz (51.8 kg)   SpO2 98%   BMI 20.89 kg/m  BP Initially 152/78, but improved to 134/76 after talking with patient. Physical Exam Constitutional:      General: She is awake.     Appearance: She is well-developed.  HENT:     Head: Normocephalic.  Eyes:     Conjunctiva/sclera: Conjunctivae normal.  Cardiovascular:     Rate and Rhythm: Normal rate and regular rhythm.     Heart sounds: Normal heart sounds.  Pulmonary:     Effort: Pulmonary effort is normal.     Breath sounds: Normal breath sounds.  Skin:    General: Skin is warm.      Comments: Rash on back/neck mainly scars and scabbed over, healing areas. No new lesions.  Neurological:     Mental Status: She is alert and oriented to person, place, and time.  Psychiatric:        Attention and Perception: Attention normal.        Mood and Affect: Mood normal.        Speech: Speech normal.        Behavior: Behavior is cooperative.     No results found for any visits on 10/19/21.  Assessment & Plan     Problem List Items Addressed This Visit       Cardiovascular and Mediastinum   Primary hypertension - Primary    Initially elevated, but on repeat normal. I suspect white coat HTN despite pt's calm demeanor. Advised they check BP at home.  Warning signs of hypotension discussed        Musculoskeletal and Integument   Dermatitis    Still unknown etiology, but improved with kenalog. Will continue to monitor.      Relevant Medications   triamcinolone ointment (KENALOG) 0.5 %     Other   HLD (hyperlipidemia)    LDL >120 but < 189 on last lipid panel. Discussed pros and cons of restarting a statin at 74.  At this time we will monitor lipids.   Discussed elevated risk of stroke, suggested 81 mg of ASA daily, husband declines      Return in about 4 months (around 02/17/2022) for hypertension, hyperlipidemia.      I, Mikey Kirschner, PA-C have reviewed all documentation for this visit. The documentation on  10/19/2021 for the exam, diagnosis, procedures, and orders are all accurate and complete.    Mikey Kirschner, PA-C  [facility] [phone] (phone) [phone] (fax)  [place]

## 2021-10-19 NOTE — Assessment & Plan Note (Addendum)
Initially elevated, but on repeat normal. I suspect white coat HTN despite pt's calm demeanor. Advised they check BP at home.  Warning signs of hypotension discussed. Continue 2.5 mg amlodipine.

## 2021-10-19 NOTE — Assessment & Plan Note (Signed)
LDL >120 but < 189 on last lipid panel. Discussed pros and cons of restarting a statin at 74.  At this time we will monitor lipids.   Discussed elevated risk of stroke, suggested 81 mg of ASA daily, husband declines

## 2021-12-08 ENCOUNTER — Other Ambulatory Visit: Payer: Self-pay | Admitting: Physician Assistant

## 2021-12-08 DIAGNOSIS — I10 Essential (primary) hypertension: Secondary | ICD-10-CM

## 2022-01-25 ENCOUNTER — Telehealth: Payer: Self-pay | Admitting: Physician Assistant

## 2022-01-25 NOTE — Telephone Encounter (Signed)
Copied from CRM [id number]. Topic: Medicare AWV ?>> Jan 25, 2022 10:24 AM Claudette Laws R wrote: ?Reason for CRM:  ?Left message for patient to call back and schedule Medicare Annual Wellness Visit (AWV) in office.  ? ?If not able to come in office, please offer to do virtually or by telephone.  ? ?Last AWV: 08/13/2020 ? ?Please schedule at anytime with [facility]. ? ?If any questions, please contact me at [phone] ?

## 2022-02-14 ENCOUNTER — Ambulatory Visit (INDEPENDENT_AMBULATORY_CARE_PROVIDER_SITE_OTHER): Payer: Medicare Other

## 2022-02-14 VITALS — BP 120/80 | HR 69 | Temp 96.9°F | Ht 62.0 in | Wt 119.8 lb

## 2022-02-14 DIAGNOSIS — Z0000 Encounter for general adult medical examination without abnormal findings: Secondary | ICD-10-CM

## 2022-02-14 NOTE — Patient Instructions (Signed)
Michele Lawrence , ?Thank you for taking time to come for your Medicare Wellness Visit. I appreciate your ongoing commitment to your health goals. Please review the following plan we discussed and let me know if I can assist you in the future.  ? ?Screening recommendations/referrals: ?Colonoscopy: aged out ?Mammogram: aged out ?Bone Density: declined referral ?Recommended yearly ophthalmology/optometry visit for glaucoma screening and checkup ?Recommended yearly dental visit for hygiene and checkup ? ?Vaccinations: ?Influenza vaccine: states had shot this season ?Pneumococcal vaccine: 04/18/18 ?Tdap vaccine: 12/05/11, due ?Shingles vaccine: Zostavax 08/24/10   ?Covid-19:10/10/2, 12/21/19 ? ?Advanced directives: yes, requested copy ? ?Conditions/risks identified: no ? ?Next appointment: Follow up in one year for your annual wellness visit -  ? ? ?Preventive Care 16 Years and Older, Female ?Preventive care refers to lifestyle choices and visits with your health care provider that can promote health and wellness. ?What does preventive care include? ?A yearly physical exam. This is also called an annual well check. ?Dental exams once or twice a year. ?Routine eye exams. Ask your health care provider how often you should have your eyes checked. ?Personal lifestyle choices, including: ?Daily care of your teeth and gums. ?Regular physical activity. ?Eating a healthy diet. ?Avoiding tobacco and drug use. ?Limiting alcohol use. ?Practicing safe sex. ?Taking low-dose aspirin every day. ?Taking vitamin and mineral supplements as recommended by your health care provider. ?What happens during an annual well check? ?The services and screenings done by your health care provider during your annual well check will depend on your age, overall health, lifestyle risk factors, and family history of disease. ?Counseling  ?Your health care provider may ask you questions about your: ?Alcohol use. ?Tobacco use. ?Drug use. ?Emotional  well-being. ?Home and relationship well-being. ?Sexual activity. ?Eating habits. ?History of falls. ?Memory and ability to understand (cognition). ?Work and work Astronomer. ?Reproductive health. ?Screening  ?You may have the following tests or measurements: ?Height, weight, and BMI. ?Blood pressure. ?Lipid and cholesterol levels. These may be checked every 5 years, or more frequently if you are over 31 years old. ?Skin check. ?Lung cancer screening. You may have this screening every year starting at age 27 if you have a 30-pack-year history of smoking and currently smoke or have quit within the past 15 years. ?Fecal occult blood test (FOBT) of the stool. You may have this test every year starting at age 29. ?Flexible sigmoidoscopy or colonoscopy. You may have a sigmoidoscopy every 5 years or a colonoscopy every 10 years starting at age 29. ?Hepatitis C blood test. ?Hepatitis B blood test. ?Sexually transmitted disease (STD) testing. ?Diabetes screening. This is done by checking your blood sugar (glucose) after you have not eaten for a while (fasting). You may have this done every 1-3 years. ?Bone density scan. This is done to screen for osteoporosis. You may have this done starting at age 39. ?Mammogram. This may be done every 1-2 years. Talk to your health care provider about how often you should have regular mammograms. ?Talk with your health care provider about your test results, treatment options, and if necessary, the need for more tests. ?Vaccines  ?Your health care provider may recommend certain vaccines, such as: ?Influenza vaccine. This is recommended every year. ?Tetanus, diphtheria, and acellular pertussis (Tdap, Td) vaccine. You may need a Td booster every 10 years. ?Zoster vaccine. You may need this after age 52. ?Pneumococcal 13-valent conjugate (PCV13) vaccine. One dose is recommended after age 34. ?Pneumococcal polysaccharide (PPSV23) vaccine. One dose is recommended after age  71. ?Talk to your  health care provider about which screenings and vaccines you need and how often you need them. ?This information is not intended to replace advice given to you by your health care provider. Make sure you discuss any questions you have with your health care provider. ?Document Released: 11/20/2015 Document Revised: 07/13/2016 Document Reviewed: 08/25/2015 ?Elsevier Interactive Patient Education ? 2017 [place]. ? ?Fall Prevention in the Home ?Falls can cause injuries. They can happen to people of all ages. There are many things you can do to make your home safe and to help prevent falls. ?What can I do on the outside of my home? ?Regularly fix the edges of walkways and driveways and fix any cracks. ?Remove anything that might make you trip as you walk through a door, such as a raised step or threshold. ?Trim any bushes or trees on the path to your home. ?Use bright outdoor lighting. ?Clear any walking paths of anything that might make someone trip, such as rocks or tools. ?Regularly check to see if handrails are loose or broken. Make sure that both sides of any steps have handrails. ?Any raised decks and porches should have guardrails on the edges. ?Have any leaves, snow, or ice cleared regularly. ?Use sand or salt on walking paths during winter. ?Clean up any spills in your garage right away. This includes oil or grease spills. ?What can I do in the bathroom? ?Use night lights. ?Install grab bars by the toilet and in the tub and shower. Do not use towel bars as grab bars. ?Use non-skid mats or decals in the tub or shower. ?If you need to sit down in the shower, use a plastic, non-slip stool. ?Keep the floor dry. Clean up any water that spills on the floor as soon as it happens. ?Remove soap buildup in the tub or shower regularly. ?Attach bath mats securely with double-sided non-slip rug tape. ?Do not have throw rugs and other things on the floor that can make you trip. ?What can I do in the bedroom? ?Use night  lights. ?Make sure that you have a light by your bed that is easy to reach. ?Do not use any sheets or blankets that are too big for your bed. They should not hang down onto the floor. ?Have a firm chair that has side arms. You can use this for support while you get dressed. ?Do not have throw rugs and other things on the floor that can make you trip. ?What can I do in the kitchen? ?Clean up any spills right away. ?Avoid walking on wet floors. ?Keep items that you use a lot in easy-to-reach places. ?If you need to reach something above you, use a strong step stool that has a grab bar. ?Keep electrical cords out of the way. ?Do not use floor polish or wax that makes floors slippery. If you must use wax, use non-skid floor wax. ?Do not have throw rugs and other things on the floor that can make you trip. ?What can I do with my stairs? ?Do not leave any items on the stairs. ?Make sure that there are handrails on both sides of the stairs and use them. Fix handrails that are broken or loose. Make sure that handrails are as long as the stairways. ?Check any carpeting to make sure that it is firmly attached to the stairs. Fix any carpet that is loose or worn. ?Avoid having throw rugs at the top or bottom of the stairs. If you do have  throw rugs, attach them to the floor with carpet tape. ?Make sure that you have a light switch at the top of the stairs and the bottom of the stairs. If you do not have them, ask someone to add them for you. ?What else can I do to help prevent falls? ?Wear shoes that: ?Do not have high heels. ?Have rubber bottoms. ?Are comfortable and fit you well. ?Are closed at the toe. Do not wear sandals. ?If you use a stepladder: ?Make sure that it is fully opened. Do not climb a closed stepladder. ?Make sure that both sides of the stepladder are locked into place. ?Ask someone to hold it for you, if possible. ?Clearly mark and make sure that you can see: ?Any grab bars or handrails. ?First and last  steps. ?Where the edge of each step is. ?Use tools that help you move around (mobility aids) if they are needed. These include: ?Canes. ?Walkers. ?Scooters. ?Crutches. ?Turn on the lights when you go into a dark area. Replace

## 2022-02-14 NOTE — Progress Notes (Signed)
? ?Subjective:  ? Michele LinesMaureen A Lawrence is a 76 y.o. female who presents for Medicare Annual (Subsequent) preventive examination. ? ?Review of Systems    ? ?  ? ?   ?Objective:  ?  ?Today's Vitals  ? 02/14/22 1203  ?BP: 120/80  ?Pulse: 69  ?Temp: (!) 96.9 ?F (36.1 ?C)  ?TempSrc: Skin  ?SpO2: 100%  ?Weight: 119 lb 12.8 oz (54.3 kg)  ?Height: 5\' 2"  (1.575 m)  ? ?Body mass index is 21.91 kg/m?. ? ? ?  08/13/2020  ? 10:31 AM 08/25/2016  ?  1:55 PM 06/29/2016  ?  3:27 PM 06/13/2016  ?  2:22 PM 02/17/2016  ?  9:12 AM 08/19/2015  ?  8:37 AM  ?Advanced Directives  ?Does Patient Have a Medical Advance Directive? Yes Yes Yes No Yes Yes  ?Type of Estate agentAdvance Directive Healthcare Power of [place];Living will Healthcare Power of [place];Living [place]   Living will;Healthcare Power of Attorney Living will  ?Copy of Healthcare Power of Attorney in Chart? No - copy requested       ? ? ?Current Medications (verified) ?Outpatient Encounter Medications as of 02/14/2022  ?Medication Sig  ? acetaminophen (TYLENOL) 500 MG tablet Take 500 mg by mouth every 6 (six) hours as needed for moderate pain or headache. Reported on 03/18/2016  ? amLODipine (NORVASC) 2.5 MG tablet TAKE ONE TABLET BY MOUTH DAILY  ? donepezil (ARICEPT) 10 MG tablet Take 10 mg by mouth at bedtime.   ? donepezil (ARICEPT) 10 MG tablet Take 1 tablet by mouth daily. Take 1 tablet daily with breakfast  ? memantine (NAMENDA) 10 MG tablet Take 10 mg by mouth daily.  ? MULTIPLE VITAMINS-MINERALS PO Take 1 tablet by mouth daily.  ? triamcinolone ointment (KENALOG) 0.5 % Apply 1 application topically 2 (two) times daily.  ? ?No facility-administered encounter medications on file as of 02/14/2022.  ? ? ?Allergies (verified) ?Penicillins  ? ?History: ?Past Medical History:  ?Diagnosis Date  ? Asthma   ? Stroke [facility](HCC)   ? ?Past Surgical History:  ?Procedure Laterality Date  ? APPENDECTOMY    ? TONSILLECTOMY    ? ?Family History  ?Problem Relation Age of Onset  ? Cancer Mother   ?     breast  ?  Diabetes Mother   ? Stroke Mother   ? CVA Mother   ? Cataracts Mother   ? Psoriasis Mother   ? Breast cancer Mother [id number]  ? Alcohol abuse Father   ? Heart disease Sister   ? ?Social History  ? ?Socioeconomic History  ? Marital status: Married  ?  Spouse name: Not on file  ? Number of children: 4  ? Years of education: Not on file  ? Highest education level: Some college, no degree  ?Occupational History  ? Occupation: retired  ?Tobacco Use  ? Smoking status: Never  ? Smokeless tobacco: Never  ?Substance and Sexual Activity  ? Alcohol use: Not Currently  ? Drug use: No  ? Sexual activity: Not on file  ?Other Topics Concern  ? Not on file  ?Social History Narrative  ? Not on file  ? ?Social Determinants of Health  ? ?Financial Resource Strain: Not on file  ?Food Insecurity: Not on file  ?Transportation Needs: Not on file  ?Physical Activity: Not on file  ?Stress: Not on file  ?Social Connections: Not on file  ? ? ?Tobacco Counseling ?Counseling given: Not Answered ? ? ?Clinical Intake: ? ?Pre-visit preparation completed: Yes ? ?Pain : No/denies  pain ? ?  ? ?Nutritional Risks: None ?Diabetes: No ? ?How often do you need to have someone help you when you read instructions, pamphlets, or other written materials from your doctor or pharmacy?: 1 - Never ? ?Diabetic?no ? ?Interpreter Needed?: No ? ?Information entered by :: Kennedy Bucker, LPN ? ? ?Activities of Daily Living ? ?  09/17/2021  ?  3:38 PM  ?In your present state of health, do you have any difficulty performing the following activities:  ?Hearing? 0  ?Vision? 0  ?Difficulty concentrating or making decisions? 1  ?Walking or climbing stairs? 0  ?Dressing or bathing? 0  ?Doing errands, shopping? 1  ? ? ?Patient Care Team: ?Burnett Corrente as PCP - General (Physician Assistant) ? ?Indicate any recent Medical Services you may have received from other than [facility] providers in the past year (date may be approximate). ? ?   ?Assessment:  ? This is a routine wellness  examination for [facility]. ? ?Hearing/Vision screen ?No results found. ? ?Dietary issues and exercise activities discussed: ?  ? ? Goals Addressed   ?None ?  ? ?Depression Screen ? ?  09/17/2021  ?  3:38 PM 08/13/2020  ? 10:26 AM 07/23/2020  ?  1:17 PM 03/02/2020  ? 10:05 AM 09/27/2017  ?  8:56 AM 08/25/2016  ?  2:00 PM 02/17/2016  ?  9:13 AM  ?PHQ 2/9 Scores  ?PHQ - 2 Score 0 0 0 0 0 0 0  ?PHQ- 9 Score 0  0 3 0    ?  ?Fall Risk ? ?  09/17/2021  ?  3:37 PM 08/13/2020  ? 10:31 AM 07/23/2020  ?  1:16 PM 03/02/2020  ? 10:04 AM 06/10/2019  ?  6:25 PM  ?Fall Risk   ?Falls in the past year? 0 0 0 0   ?Comment     Emmi Telephone Survey: data to providers prior to load  ?Number falls in past yr: 0 0 0 0   ?Comment     Emmi Telephone Survey Actual Response =   ?Injury with Fall? 0 0 0 0   ?Risk for fall due to : No Fall Risks  No Fall Risks No Fall Risks   ?Follow up   Falls evaluation completed Falls evaluation completed   ? ? ?FALL RISK PREVENTION PERTAINING TO THE HOME: ? ?Any stairs in or around the home? Yes  ?If so, are there any without handrails? No  ?Home free of loose throw rugs in walkways, pet beds, electrical cords, etc? Yes  ?Adequate lighting in your home to reduce risk of falls? Yes  ? ?ASSISTIVE DEVICES UTILIZED TO PREVENT FALLS: ? ?Life alert? No  ?Use of a cane, walker or w/c? No  ?Grab bars in the bathroom? Yes  ?Shower chair or bench in shower? No  ?Elevated toilet seat or a handicapped toilet? Yes  ? ?TIMED UP AND GO: ? ?Was the test performed? Yes .  ?Length of time to ambulate 10 feet: 4 sec.  ? ?Gait steady and fast without use of assistive device ? ?Cognitive Function: ? ?  03/02/2020  ? 10:09 AM  ?MMSE - Mini Mental State Exam  ?Orientation to time 1  ?Orientation to Place 3  ?Registration 3  ?Attention/ Calculation 4  ?Recall 2  ?Language- name 2 objects 2  ?Language- repeat 1  ?Language- follow 3 step command 3  ?Language- read & follow direction 1  ?Write a sentence 1  ?Copy design 0  ?Total score  21  ? ?   ? ?  03/02/2020  ? 10:06 AM 10/20/2016  ? 11:43 AM  ?6CIT Screen  ?What Year? 4 points 0 points  ?What month? 0 points 0 points  ?What time? 0 points 0 points  ?Count back from 20 4 points 0 points  ?Months in reverse 4 points 0 points  ?Repeat phrase 8 points 2 points  ?Total Score 20 points 2 points  ? ? ?Immunizations ?Immunization History  ?Administered Date(s) Administered  ? Fluad Quad(high Dose 65+) 08/30/2021  ? Influenza, High Dose Seasonal PF 07/19/2018  ? Moderna Sars-Covid-2 Vaccination 11/17/2019, 12/21/2019  ? Pneumococcal Conjugate-13 04/18/2018  ? Pneumococcal Polysaccharide-23 02/18/2010  ? Td 12/05/2011  ? Tdap 12/05/2011  ? Zoster, Live 08/24/2010  ? ? ?TDAP status: Due, Education has been provided regarding the importance of this vaccine. Advised may receive this vaccine at local pharmacy or Health Dept. Aware to provide a copy of the vaccination record if obtained from local pharmacy or Health Dept. Verbalized acceptance and understanding. ? ?Flu Vaccine status: Up to date ? ?Pneumococcal vaccine status: Up to date ? ?Covid-19 vaccine status: Completed vaccines ? ?Qualifies for Shingles Vaccine? Yes   ?Zostavax completed Yes   ?Shingrix Completed?: No.    Education has been provided regarding the importance of this vaccine. Patient has been advised to call insurance company to determine out of pocket expense if they have not yet received this vaccine. Advised may also receive vaccine at local pharmacy or Health Dept. Verbalized acceptance and understanding. ? ?Screening Tests ?Health Maintenance  ?Topic Date Due  ? Zoster Vaccines- Shingrix (1 of 2) Never done  ? COVID-19 Vaccine (3 - Moderna risk series) 01/18/2020  ? TETANUS/TDAP  12/04/2021  ? DEXA SCAN  09/17/2022 (Originally 10/26/2009)  ? INFLUENZA VACCINE  06/07/2022  ? Pneumonia Vaccine 71+ Years old  Completed  ? Hepatitis C Screening  Completed  ? HPV VACCINES  Aged Out  ? ? ?Health Maintenance ? ?Health Maintenance Due  ?Topic Date  Due  ? Zoster Vaccines- Shingrix (1 of 2) Never done  ? COVID-19 Vaccine (3 - Moderna risk series) 01/18/2020  ? TETANUS/TDAP  12/04/2021  ? ? ?Colorectal cancer screening: No longer required.  ? ?Mammogram

## 2022-06-03 ENCOUNTER — Other Ambulatory Visit: Payer: Self-pay | Admitting: Physician Assistant

## 2022-06-03 DIAGNOSIS — I10 Essential (primary) hypertension: Secondary | ICD-10-CM

## 2022-06-03 NOTE — Telephone Encounter (Signed)
Medication Refill - Medication: amLODipine (NORVASC) 2.5 MG tablet   Has the patient contacted their pharmacy? Yes.   (Agent: If no, request that the patient contact the pharmacy for the refill. If patient does not wish to contact the pharmacy document the reason why and proceed with request.) (Agent: If yes, when and what did the pharmacy advise?)  Preferred Pharmacy (with phone number or street name): [id number] [place], [place] - [address]  [address] [place], [place] [address]  Phone:  [phone]  Fax:  [phone]   Has the patient been seen for an appointment in the last year OR does the patient have an upcoming appointment? Yes.    Agent: Please be advised that RX refills may take up to 3 business days. We ask that you follow-up with your pharmacy.

## 2022-06-06 MED ORDER — AMLODIPINE BESYLATE 2.5 MG PO TABS: 2.5000 mg | ORAL_TABLET | Freq: Every day | ORAL | 0 refills | Status: DC

## 2022-06-06 NOTE — Telephone Encounter (Signed)
Requested Prescriptions  Pending Prescriptions Disp Refills  . amLODipine (NORVASC) 2.5 MG tablet 90 tablet 1    Sig: Take 1 tablet (2.5 mg total) by mouth daily.     Cardiovascular: Calcium Channel Blockers 2 Passed - 06/03/2022  3:55 PM      Passed - Last BP in normal range    BP Readings from Last 1 Encounters:  02/14/22 120/80         Passed - Last Heart Rate in normal range    Pulse Readings from Last 1 Encounters:  02/14/22 69         Passed - Valid encounter within last 6 months    Recent Outpatient Visits          7 months ago Primary hypertension   [facility] [place], [place], PA-C   8 months ago Primary hypertension   [facility] [place], [place], PA-C   1 year ago Dementia without behavioral disturbance, unspecified dementia type [facility])   [facility] [place], [place], [place]   2 years ago Muscle tightness   [facility] [place], Marzella Schlein, [place]   2 years ago Dementia without behavioral disturbance, unspecified dementia type [facility])   [facility], Marzella Schlein, MD

## 2022-08-16 ENCOUNTER — Encounter: Payer: Self-pay | Admitting: Physician Assistant

## 2022-08-16 ENCOUNTER — Ambulatory Visit (INDEPENDENT_AMBULATORY_CARE_PROVIDER_SITE_OTHER): Payer: Medicare Other | Admitting: Physician Assistant

## 2022-08-16 VITALS — BP 128/70 | HR 88 | Temp 97.8°F | Resp 16 | Wt 114.0 lb

## 2022-08-16 DIAGNOSIS — F0280 Dementia in other diseases classified elsewhere without behavioral disturbance: Secondary | ICD-10-CM | POA: Diagnosis not present

## 2022-08-16 DIAGNOSIS — I10 Essential (primary) hypertension: Secondary | ICD-10-CM

## 2022-08-16 DIAGNOSIS — L608 Other nail disorders: Secondary | ICD-10-CM

## 2022-08-16 DIAGNOSIS — G301 Alzheimer's disease with late onset: Secondary | ICD-10-CM | POA: Diagnosis not present

## 2022-08-16 NOTE — Assessment & Plan Note (Signed)
Pt has a f/u appt with neurology next month to monitor progression.  Next visit here consider mmse

## 2022-08-16 NOTE — Assessment & Plan Note (Signed)
Not a formal f/u today but well controlled continue current medication

## 2022-08-16 NOTE — Progress Notes (Signed)
I,Roshena L Chambers,acting as a scribe for [employer], PA-C.,have documented all relevant documentation on the behalf of Mikey Kirschner, PA-C,as directed by  Mikey Kirschner, PA-C while in the presence of Mikey Kirschner, PA-C.   Established patient visit   [name]   DOB: Sep 09, 1946   76 y.o. Female  MRN: [id number] Visit Date: 08/16/2022  Today's healthcare provider: Mikey Kirschner, PA-C   Chief Complaint  Patient presents with   Nail Problem   Subjective    HPI  Toe nail problem: Patient is here today requesting a podiatry referral for toenail trimming. Reports thick, curved toenails. Denies pain.   Medications: Outpatient Medications Prior to Visit  Medication Sig   acetaminophen (TYLENOL) 500 MG tablet Take 500 mg by mouth every 6 (six) hours as needed for moderate pain or headache. Reported on 03/18/2016   amLODipine (NORVASC) 2.5 MG tablet Take 1 tablet (2.5 mg total) by mouth daily.   donepezil (ARICEPT) 10 MG tablet Take 1 tablet by mouth daily. Take 1 tablet daily with breakfast   memantine (NAMENDA) 10 MG tablet Take 10 mg by mouth daily.   MULTIPLE VITAMINS-MINERALS PO Take 1 tablet by mouth daily.   triamcinolone ointment (KENALOG) 0.5 % Apply 1 application topically 2 (two) times daily.   donepezil (ARICEPT) 10 MG tablet Take 10 mg by mouth at bedtime.    No facility-administered medications prior to visit.    Review of Systems  Constitutional:  Negative for appetite change, chills, fatigue and fever.  Respiratory:  Negative for chest tightness and shortness of breath.   Cardiovascular:  Negative for chest pain and palpitations.  Gastrointestinal:  Negative for abdominal pain, nausea and vomiting.  Neurological:  Negative for dizziness and weakness.     Objective    BP 128/70 (BP Location: Left Arm, Patient Position: Sitting, Cuff Size: Normal)   Pulse 88   Temp 97.8 F (36.6 C) (Oral)   Resp 16   Wt 114 lb (51.7 kg)   SpO2  97% Comment: room air  BMI 20.85 kg/m    Physical Exam Vitals reviewed.  Constitutional:      Appearance: She is not ill-appearing.  HENT:     Head: Normocephalic.  Eyes:     Conjunctiva/sclera: Conjunctivae normal.  Cardiovascular:     Rate and Rhythm: Normal rate.  Pulmonary:     Effort: Pulmonary effort is normal. No respiratory distress.  Feet:     Right foot:     Toenail Condition: Right toenails are abnormally thick and long. Fungal disease present.    Left foot:     Toenail Condition: Left toenails are abnormally thick and long. Fungal disease present. Neurological:     General: No focal deficit present.     Mental Status: She is alert and oriented to person, place, and time.  Psychiatric:        Mood and Affect: Mood normal.        Behavior: Behavior normal.      No results found for any visits on 08/16/22.  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  Next  visit here consider mmse       Other Visit Diagnoses     Toenail deformity    -  Primary   Relevant Orders   Ambulatory referral to Podiatry        Return in about 4 months (around 12/17/2022) for chronic conditions.      I, Alfredia Ferguson, PA-C have reviewed all documentation for this visit. The documentation on  08/16/2022 for the exam, diagnosis, procedures, and orders are all accurate and complete.  Alfredia Ferguson, PA-C [facility] [address] [place], [place], [address] Office: [phone] Fax: [phone]   [facility]

## 2022-08-22 ENCOUNTER — Telehealth: Payer: Self-pay

## 2022-08-22 NOTE — Telephone Encounter (Signed)
Copied from [place] [id number]. Topic: Referral - Status >> Aug 19, 2022  2:39 PM Cyndi Bender wrote: Reason for CRM: Pt husband called to request that the podiatry office calls pt at the home# [phone]

## 2022-08-23 ENCOUNTER — Ambulatory Visit: Payer: Medicare Other | Admitting: Podiatry

## 2022-08-23 DIAGNOSIS — M79675 Pain in left toe(s): Secondary | ICD-10-CM | POA: Diagnosis not present

## 2022-08-23 DIAGNOSIS — M79674 Pain in right toe(s): Secondary | ICD-10-CM | POA: Diagnosis not present

## 2022-08-23 DIAGNOSIS — B351 Tinea unguium: Secondary | ICD-10-CM | POA: Diagnosis not present

## 2022-08-23 NOTE — Progress Notes (Signed)
   Chief Complaint  Patient presents with   Nail Problem    Patient is here for bilateral toe nail thickness and toe nail trim.    SUBJECTIVE Patient presents to office today complaining of elongated, thickened nails that cause pain while ambulating in shoes.  Patient is unable to trim their own nails. Patient is here for further evaluation and treatment.  Past Medical History:  Diagnosis Date   Asthma    Stroke [facility])     Allergies  Allergen Reactions   Penicillins      OBJECTIVE General Patient is awake, alert, and oriented x 3 and in no acute distress. Derm Skin is dry and supple bilateral. Negative open lesions or macerations. Remaining integument unremarkable. Nails are tender, long, thickened and dystrophic with subungual debris, consistent with onychomycosis, 1-5 bilateral. No signs of infection noted. Vasc  DP and PT pedal pulses palpable bilaterally. Temperature gradient within normal limits.  Neuro Epicritic and protective threshold sensation grossly intact bilaterally.  Musculoskeletal Exam No symptomatic pedal deformities noted bilateral. Muscular strength within normal limits.  ASSESSMENT 1.  Pain due to onychomycosis of toenails both  PLAN OF CARE 1. Patient evaluated today.  2. Instructed to maintain good pedal hygiene and foot care.  3. Mechanical debridement of nails 1-5 bilaterally performed using a nail nipper. Filed with dremel without incident.  4. Return to clinic in 3 mos.    Edrick Kins, DPM [facility]  Dr. Edrick Kins, DPM    [address], [place] [address]                Office [phone]  Fax [phone]

## 2022-08-29 ENCOUNTER — Other Ambulatory Visit: Payer: Self-pay

## 2022-08-29 ENCOUNTER — Telehealth: Payer: Self-pay | Admitting: Physician Assistant

## 2022-08-29 DIAGNOSIS — I10 Essential (primary) hypertension: Secondary | ICD-10-CM

## 2022-08-29 MED ORDER — AMLODIPINE BESYLATE 2.5 MG PO TABS: 2.5000 mg | ORAL_TABLET | Freq: Every day | ORAL | 0 refills | Status: DC

## 2022-08-29 NOTE — Telephone Encounter (Signed)
[facility] faxed refill request for the following medications:  amLODipine (NORVASC) 2.5 MG tablet   Please advise.

## 2022-09-07 DIAGNOSIS — G301 Alzheimer's disease with late onset: Secondary | ICD-10-CM | POA: Diagnosis not present

## 2022-09-12 DIAGNOSIS — F419 Anxiety disorder, unspecified: Secondary | ICD-10-CM | POA: Insufficient documentation

## 2022-11-25 ENCOUNTER — Ambulatory Visit: Payer: Medicare Other | Admitting: Podiatry

## 2022-11-29 ENCOUNTER — Ambulatory Visit: Payer: Medicare Other | Admitting: Podiatry

## 2022-11-29 VITALS — BP 131/67 | HR 70

## 2022-11-29 DIAGNOSIS — M79675 Pain in left toe(s): Secondary | ICD-10-CM

## 2022-11-29 DIAGNOSIS — M79674 Pain in right toe(s): Secondary | ICD-10-CM

## 2022-11-29 DIAGNOSIS — B351 Tinea unguium: Secondary | ICD-10-CM | POA: Diagnosis not present

## 2022-11-29 NOTE — Progress Notes (Signed)
   Chief Complaint  Patient presents with   Nail Problem    Routine foot care, nail trim     SUBJECTIVE Patient presents to office today complaining of elongated, thickened nails that cause pain while ambulating in shoes.  Patient is unable to trim their own nails. Patient is here for further evaluation and treatment.  Past Medical History:  Diagnosis Date   Asthma    Stroke [facility])     Allergies  Allergen Reactions   Penicillins      OBJECTIVE General Patient is awake, alert, and oriented x 3 and in no acute distress. Derm Skin is dry and supple bilateral. Negative open lesions or macerations. Remaining integument unremarkable. Nails are tender, long, thickened and dystrophic with subungual debris, consistent with onychomycosis, 1-5 bilateral. No signs of infection noted. Vasc  DP and PT pedal pulses palpable bilaterally. Temperature gradient within normal limits.  Neuro Epicritic and protective threshold sensation grossly intact bilaterally.  Musculoskeletal Exam No symptomatic pedal deformities noted bilateral. Muscular strength within normal limits.  ASSESSMENT 1.  Pain due to onychomycosis of toenails both  PLAN OF CARE 1. Patient evaluated today.  2. Instructed to maintain good pedal hygiene and foot care.  3. Mechanical debridement of nails 1-5 bilaterally performed using a nail nipper. Filed with dremel without incident.  4. Return to clinic in 3 mos.    Edrick Kins, DPM [facility]  Dr. Edrick Kins, DPM    [address], [place] [address]                Office [phone]  Fax [phone]

## 2022-12-05 ENCOUNTER — Telehealth: Payer: Self-pay | Admitting: Physician Assistant

## 2022-12-05 DIAGNOSIS — I10 Essential (primary) hypertension: Secondary | ICD-10-CM

## 2022-12-05 MED ORDER — AMLODIPINE BESYLATE 2.5 MG PO TABS: 2.5000 mg | ORAL_TABLET | Freq: Every day | ORAL | 0 refills | Status: DC

## 2022-12-05 NOTE — Telephone Encounter (Signed)
[facility] faxed refill request for the following medications:   amLODipine (NORVASC) 2.5 MG tablet    Please advise

## 2022-12-28 ENCOUNTER — Telehealth: Payer: Self-pay | Admitting: Physician Assistant

## 2022-12-28 NOTE — Telephone Encounter (Signed)
Contacted Burnard Leigh to schedule their annual wellness visit. Appointment made for 02/22/2023.  [place] Direct Dial: [phone]

## 2023-01-09 ENCOUNTER — Other Ambulatory Visit: Payer: Self-pay | Admitting: Physician Assistant

## 2023-01-09 ENCOUNTER — Telehealth: Payer: Self-pay | Admitting: Physician Assistant

## 2023-01-09 DIAGNOSIS — G301 Alzheimer's disease with late onset: Secondary | ICD-10-CM

## 2023-01-09 NOTE — Telephone Encounter (Signed)
Pts husband is calling to [facility] is no longer under [facility]. Pt husband is requesting a referral to be sent to [facility] since [facility] is no longer in network with [facility]. [phone]

## 2023-01-09 NOTE — Telephone Encounter (Signed)
Advised 

## 2023-02-22 ENCOUNTER — Ambulatory Visit (INDEPENDENT_AMBULATORY_CARE_PROVIDER_SITE_OTHER): Payer: Medicare Other

## 2023-02-22 VITALS — BP 118/70 | Ht 65.0 in | Wt 113.2 lb

## 2023-02-22 DIAGNOSIS — Z0000 Encounter for general adult medical examination without abnormal findings: Secondary | ICD-10-CM | POA: Diagnosis not present

## 2023-02-22 NOTE — Progress Notes (Signed)
Subjective:   Michele Lawrence is a 77 y.o. female who presents for Medicare Annual (Subsequent) preventive examination.  Review of Systems    Cardiac Risk Factors include: hypertension;dyslipidemia;sedentary lifestyle;advanced age (>63men, >36 women)    Objective:    Today's Vitals   02/22/23 1532  BP: 118/70  Weight: 113 lb 3.2 oz (51.3 kg)  Height:  (1.651 m)   Body mass index is 18.84 kg/m.     02/22/2023    3:43 PM 02/14/2022   12:10 PM 08/13/2020   10:31 AM 08/25/2016    1:55 PM 06/29/2016    3:27 PM 06/13/2016    2:22 PM 02/17/2016    9:12 AM  Advanced Directives  Does Patient Have a Medical Advance Directive? Yes Yes Yes Yes Yes No Yes  Type of Special educational needs teacher of [place];Living will Healthcare Power of [place];Living will Healthcare Power of [place];Living will   Living will;Healthcare Power of Attorney  Does patient want to make changes to medical advance directive?  Yes (Inpatient - patient defers changing a medical advance directive and declines information at this time)       Copy of Healthcare Power of Attorney in Chart?  No - copy requested No - copy requested        Current Medications (verified) Outpatient Encounter Medications as of 02/22/2023  Medication Sig   acetaminophen (TYLENOL) 500 MG tablet Take 500 mg by mouth every 6 (six) hours as needed for moderate pain or headache. Reported on 03/18/2016   amLODipine (NORVASC) 2.5 MG tablet Take 1 tablet (2.5 mg total) by mouth daily.   donepezil (ARICEPT) 10 MG tablet Take 1 tablet by mouth daily. Take 1 tablet daily with breakfast   triamcinolone ointment (KENALOG) 0.5 % Apply 1 application topically 2 (two) times daily.   donepezil (ARICEPT) 10 MG tablet Take 10 mg by mouth at bedtime.    memantine (NAMENDA) 10 MG tablet Take 10 mg by mouth daily.   MULTIPLE VITAMINS-MINERALS PO Take 1 tablet by mouth daily. (Patient not taking: Reported on 02/22/2023)   No facility-administered  encounter medications on file as of 02/22/2023.    Allergies (verified) Penicillins   History: Past Medical History:  Diagnosis Date   Asthma    Stroke    Past Surgical History:  Procedure Laterality Date   APPENDECTOMY     TONSILLECTOMY     Family History  Problem Relation Age of Onset   Cancer Mother        breast   Diabetes Mother    Stroke Mother    CVA Mother    Cataracts Mother    Psoriasis Mother    Breast cancer Mother 38   Alcohol abuse Father    Heart disease Sister    Social History   Socioeconomic History   Marital status: Married    Spouse name: Not on file   Number of children: 4   Years of education: Not on file   Highest education level: Some college, no degree  Occupational History   Occupation: retired  Tobacco Use   Smoking status: Never   Smokeless tobacco: Never  Substance and Sexual Activity   Alcohol use: Not Currently   Drug use: No   Sexual activity: Not on file  Other Topics Concern   Not on file  Social History Narrative   Not on file   Social Determinants of Health   Financial Resource Strain: Low Risk  (02/22/2023)   Overall Financial Resource  Strain (CARDIA)    Difficulty of Paying Living Expenses: Not hard at all  Food Insecurity: No Food Insecurity (02/22/2023)   Hunger Vital Sign    Worried About Running Out of Food in the Last Year: Never true    Ran Out of Food in the Last Year: Never true  Transportation Needs: No Transportation Needs (02/22/2023)   PRAPARE - Administrator, Civil Service (Medical): No    Lack of Transportation (Non-Medical): No  Physical Activity: Inactive (02/22/2023)   Exercise Vital Sign    Days of Exercise per Week: 0 days    Minutes of Exercise per Session: 0 min  Stress: No Stress Concern Present (02/22/2023)   [employer] of Occupational Health - Occupational Stress Questionnaire    Feeling of Stress : Not at all  Social Connections: Moderately Isolated (02/22/2023)   Social  Connection and Isolation Panel [NHANES]    Frequency of Communication with Friends and Family: More than three times a week    Frequency of Social Gatherings with Friends and Family: Twice a week    Attends Religious Services: Never    Database administrator or Organizations: No    Attends Engineer, structural: Never    Marital Status: Married    Tobacco Counseling Counseling given: Not Answered   Clinical Intake:  Pre-visit preparation completed: Yes  Pain : No/denies pain     BMI - recorded: 18.84 Nutritional Status: BMI <19  Underweight Nutritional Risks: None Diabetes: No  How often do you need to have someone help you when you read instructions, pamphlets, or other written materials from your doctor or pharmacy?: 1 - Never  Diabetic?no Interpreter Needed?: No  Comments: lives with husband Information entered by :: [name],LPN   Activities of Daily Living    02/22/2023    3:43 PM  In your present state of health, do you have any difficulty performing the following activities:  Hearing? 0  Vision? 0  Difficulty concentrating or making decisions? 1  Walking or climbing stairs? 0  Dressing or bathing? 1  Doing errands, shopping? 1  Preparing Food and eating ? Y  Using the Toilet? Y  In the past six months, have you accidently leaked urine? Y  Do you have problems with loss of bowel control? N  Managing your Medications? Y  Managing your Finances? Y  Housekeeping or managing your Housekeeping? Y    Patient Care Team: Michele Ferguson, PA-C as PCP - General (Physician Assistant)  Indicate any recent Medical Services you may have received from other than [facility] providers in the past year (date may be approximate).     Assessment:   This is a routine wellness examination for [facility].  Hearing/Vision screen Hearing Screening - Comments:: Adequate hearing Vision Screening - Comments:: Husband thinks pt sees well Dr Clearance Coots  Dietary issues and  exercise activities discussed: Current Exercise Habits: The patient does not participate in regular exercise at present, Exercise limited by: psychological condition(s);Other - see comments (alzheimers dx)   Goals Addressed             This Visit's Progress    DIET - EAT MORE FRUITS AND VEGETABLES   On track    DIET - INCREASE WATER INTAKE   Not on track    Recommend to drink at least 6-8 8oz glasses of water per day.       Depression Screen    02/22/2023    3:41 PM 02/14/2022   12:09  PM 09/17/2021    3:38 PM 08/13/2020   10:26 AM 07/23/2020    1:17 PM 03/02/2020   10:05 AM 09/27/2017    8:56 AM  PHQ 2/9 Scores  PHQ - 2 Score 0 0 0 0 0 0 0  PHQ- 9 Score   0  0 3 0    Fall Risk    02/22/2023    3:38 PM 02/14/2022   12:12 PM 09/17/2021    3:37 PM 08/13/2020   10:31 AM 07/23/2020    1:16 PM  Fall Risk   Falls in the past year? 0 0 0 0 0  Number falls in past yr: 0 0 0 0 0  Injury with Fall? 0 0 0 0 0  Risk for fall due to : No Fall Risks;Other (Comment) No Fall Risks No Fall Risks  No Fall Risks  Follow up  Falls evaluation completed   Falls evaluation completed    FALL RISK PREVENTION PERTAINING TO THE HOME:  Any stairs in or around the home? Yes  If so, are there any without handrails? Yes  Home free of loose throw rugs in walkways, pet beds, electrical cords, etc? Yes  Adequate lighting in your home to reduce risk of falls? Yes   ASSISTIVE DEVICES UTILIZED TO PREVENT FALLS:  Life alert? No  Use of a cane, walker or w/c? No  Grab bars in the bathroom? Yes  Shower chair or bench in shower? Yes  Elevated toilet seat or a handicapped toilet? No   TIMED UP AND GO:  Was the test performed? Yes .  Length of time to ambulate 10 feet: 14 sec.   Gait slow and steady without use of assistive device  Cognitive Function:    03/02/2020   10:09 AM  MMSE - Mini Mental State Exam  Orientation to time 1  Orientation to Place 3  Registration 3  Attention/ Calculation 4   Recall 2  Language- name 2 objects 2  Language- repeat 1  Language- follow 3 step command 3  Language- read & follow direction 1  Write a sentence 1  Copy design 0  Total score 21        03/02/2020   10:06 AM 10/20/2016   11:43 AM  6CIT Screen  What Year? 4 points 0 points  What month? 0 points 0 points  What time? 0 points 0 points  Count back from 20 4 points 0 points  Months in reverse 4 points 0 points  Repeat phrase 8 points 2 points  Total Score 20 points 2 points    Immunizations Immunization History  Administered Date(s) Administered   Fluad Quad(high Dose 65+) 08/30/2021   Influenza, High Dose Seasonal PF 07/19/2018, 07/08/2022   Moderna Sars-Covid-2 Vaccination 11/17/2019, 12/21/2019   Pneumococcal Conjugate-13 04/18/2018   Pneumococcal Polysaccharide-23 02/18/2010   Td 12/05/2011   Tdap 12/05/2011   Zoster, Live 08/24/2010    TDAP status: Up to date  Flu Vaccine status: Up to date  Pneumococcal vaccine status: Up to date  Covid-19 vaccine status: Completed vaccines  Qualifies for Shingles Vaccine? Yes   Zostavax completed Yes   Shingrix Completed?: Yes  Screening Tests Health Maintenance  Topic Date Due   Zoster Vaccines- Shingrix (1 of 2) Never done   DEXA SCAN  Never done   COVID-19 Vaccine (3 - Moderna risk series) 01/18/2020   DTaP/Tdap/Td (3 - Td or Tdap) 12/04/2021   INFLUENZA VACCINE  06/08/2023   Medicare Annual Wellness (AWV)  02/22/2024   Pneumonia Vaccine 43+ Years old  Completed   Hepatitis C Screening  Completed   HPV VACCINES  Aged Out    Health Maintenance  Health Maintenance Due  Topic Date Due   Zoster Vaccines- Shingrix (1 of 2) Never done   DEXA SCAN  Never done   COVID-19 Vaccine (3 - Moderna risk series) 01/18/2020   DTaP/Tdap/Td (3 - Td or Tdap) 12/04/2021    Colorectal cancer screening: No longer required.   Mammogram status: No longer required due to age.  Bone Density: NONE  Lung Cancer Screening: (Low  Dose CT Chest recommended if Age 32-80 years, 30 pack-year currently smoking OR have quit w/in 15years.) does not qualify.   Lung Cancer Screening Referral: no  Additional Screening:  Hepatitis C Screening: does not qualify; Completed yes  Vision Screening: Recommended annual ophthalmology exams for early detection of glaucoma and other disorders of the eye. Is the patient up to date with their annual eye exam?  No  Who is the provider or what is the name of the office in which the patient attends annual eye exams? Dr Clearance Coots If pt is not established with a provider, would they like to be referred to a provider to establish care? No .   Dental Screening: Recommended annual dental exams for proper oral hygiene  Community Resource Referral / Chronic Care Management: CRR required this visit?  No   CCM required this visit?  No     Plan:     I have personally reviewed and noted the following in the patient's chart:   Medical and social history Use of alcohol, tobacco or illicit drugs  Current medications and supplements including opioid prescriptions. Patient is not currently taking opioid prescriptions. Functional ability and status Nutritional status Physical activity Advanced directives List of other physicians Hospitalizations, surgeries, and ER visits in previous 12 months Vitals Screenings to include cognitive, depression, and falls Referrals and appointments  In addition, I have reviewed and discussed with patient certain preventive protocols, quality metrics, and best practice recommendations. A written personalized care plan for preventive services as well as general preventive health recommendations were provided to patient.    Sue Lush, LPN   1/61/0960   Nurse Notes: Pt came to visit with her husband, who is her caretaker. 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 Pt/husband have no concerns or  questions during the visit.

## 2023-02-22 NOTE — Patient Instructions (Signed)
[name] , Thank you for taking time to come for your Medicare Wellness Visit. I appreciate your ongoing commitment to your health goals. Please review the following plan we discussed and let me know if I can assist you in the future.   These are the goals we discussed:  Goals      DIET - EAT MORE FRUITS AND VEGETABLES     DIET - INCREASE WATER INTAKE     Recommend to drink at least 6-8 8oz glasses of water per day.        This is a list of the screening recommended for you and due dates:  Health Maintenance  Topic Date Due   Zoster (Shingles) Vaccine (1 of 2) Never done   DEXA scan (bone density measurement)  Never done   COVID-19 Vaccine (3 - Moderna risk series) 01/18/2020   DTaP/Tdap/Td vaccine (3 - Td or Tdap) 12/04/2021   Flu Shot  06/08/2023   Medicare Annual Wellness Visit  02/22/2024   Pneumonia Vaccine  Completed   Hepatitis C Screening: USPSTF Recommendation to screen - Ages 18-79 yo.  Completed   HPV Vaccine  Aged Out    Advanced directives: yes  Conditions/risks identified: low falls risk  Next appointment: Follow up in one year for your annual wellness visit 02/27/2024 @ 3pm in person   Preventive Care 65 Years and Older, Female Preventive care refers to lifestyle choices and visits with your health care provider that can promote health and wellness. What does preventive care include? A yearly physical exam. This is also called an annual well check. Dental exams once or twice a year. Routine eye exams. Ask your health care provider how often you should have your eyes checked. Personal lifestyle choices, including: Daily care of your teeth and gums. Regular physical activity. Eating a healthy diet. Avoiding tobacco and drug use. Limiting alcohol use. Practicing safe sex. Taking low-dose aspirin every day. Taking vitamin and mineral supplements as recommended by your health care provider. What happens during an annual well check? The services and  screenings done by your health care provider during your annual well check will depend on your age, overall health, lifestyle risk factors, and family history of disease. Counseling  Your health care provider may ask you questions about your: Alcohol use. Tobacco use. Drug use. Emotional well-being. Home and relationship well-being. Sexual activity. Eating habits. History of falls. Memory and ability to understand (cognition). Work and work Astronomer. Reproductive health. Screening  You may have the following tests or measurements: Height, weight, and BMI. Blood pressure. Lipid and cholesterol levels. These may be checked every 5 years, or more frequently if you are over 45 years old. Skin check. Lung cancer screening. You may have this screening every year starting at age 24 if you have a 30-pack-year history of smoking and currently smoke or have quit within the past 15 years. Fecal occult blood test (FOBT) of the stool. You may have this test every year starting at age 69. Flexible sigmoidoscopy or colonoscopy. You may have a sigmoidoscopy every 5 years or a colonoscopy every 10 years starting at age 32. Hepatitis C blood test. Hepatitis B blood test. Sexually transmitted disease (STD) testing. Diabetes screening. This is done by checking your blood sugar (glucose) after you have not eaten for a while (fasting). You may have this done every 1-3 years. Bone density scan. This is done to screen for osteoporosis. You may have this done starting at age 82. Mammogram. This may  be done every 1-2 years. Talk to your health care provider about how often you should have regular mammograms. Talk with your health care provider about your test results, treatment options, and if necessary, the need for more tests. Vaccines  Your health care provider may recommend certain vaccines, such as: Influenza vaccine. This is recommended every year. Tetanus, diphtheria, and acellular pertussis (Tdap,  Td) vaccine. You may need a Td booster every 10 years. Zoster vaccine. You may need this after age 97. Pneumococcal 13-valent conjugate (PCV13) vaccine. One dose is recommended after age 50. Pneumococcal polysaccharide (PPSV23) vaccine. One dose is recommended after age 31. Talk to your health care provider about which screenings and vaccines you need and how often you need them. This information is not intended to replace advice given to you by your health care provider. Make sure you discuss any questions you have with your health care provider. Document Released: 11/20/2015 Document Revised: 07/13/2016 Document Reviewed: 08/25/2015 Elsevier Interactive Patient Education  2017 [place] Prevention in the Home Falls can cause injuries. They can happen to people of all ages. There are many things you can do to make your home safe and to help prevent falls. What can I do on the outside of my home? Regularly fix the edges of walkways and driveways and fix any cracks. Remove anything that might make you trip as you walk through a door, such as a raised step or threshold. Trim any bushes or trees on the path to your home. Use bright outdoor lighting. Clear any walking paths of anything that might make someone trip, such as rocks or tools. Regularly check to see if handrails are loose or broken. Make sure that both sides of any steps have handrails. Any raised decks and porches should have guardrails on the edges. Have any leaves, snow, or ice cleared regularly. Use sand or salt on walking paths during winter. Clean up any spills in your garage right away. This includes oil or grease spills. What can I do in the bathroom? Use night lights. Install grab bars by the toilet and in the tub and shower. Do not use towel bars as grab bars. Use non-skid mats or decals in the tub or shower. If you need to sit down in the shower, use a plastic, non-slip stool. Keep the floor dry. Clean up any  water that spills on the floor as soon as it happens. Remove soap buildup in the tub or shower regularly. Attach bath mats securely with double-sided non-slip rug tape. Do not have throw rugs and other things on the floor that can make you trip. What can I do in the bedroom? Use night lights. Make sure that you have a light by your bed that is easy to reach. Do not use any sheets or blankets that are too big for your bed. They should not hang down onto the floor. Have a firm chair that has side arms. You can use this for support while you get dressed. Do not have throw rugs and other things on the floor that can make you trip. What can I do in the kitchen? Clean up any spills right away. Avoid walking on wet floors. Keep items that you use a lot in easy-to-reach places. If you need to reach something above you, use a strong step stool that has a grab bar. Keep electrical cords out of the way. Do not use floor polish or wax that makes floors slippery. If you must use  wax, use non-skid floor wax. Do not have throw rugs and other things on the floor that can make you trip. What can I do with my stairs? Do not leave any items on the stairs. Make sure that there are handrails on both sides of the stairs and use them. Fix handrails that are broken or loose. Make sure that handrails are as long as the stairways. Check any carpeting to make sure that it is firmly attached to the stairs. Fix any carpet that is loose or worn. Avoid having throw rugs at the top or bottom of the stairs. If you do have throw rugs, attach them to the floor with carpet tape. Make sure that you have a light switch at the top of the stairs and the bottom of the stairs. If you do not have them, ask someone to add them for you. What else can I do to help prevent falls? Wear shoes that: Do not have high heels. Have rubber bottoms. Are comfortable and fit you well. Are closed at the toe. Do not wear sandals. If you use a  stepladder: Make sure that it is fully opened. Do not climb a closed stepladder. Make sure that both sides of the stepladder are locked into place. Ask someone to hold it for you, if possible. Clearly mark and make sure that you can see: Any grab bars or handrails. First and last steps. Where the edge of each step is. Use tools that help you move around (mobility aids) if they are needed. These include: Canes. Walkers. Scooters. Crutches. Turn on the lights when you go into a dark area. Replace any light bulbs as soon as they burn out. Set up your furniture so you have a clear path. Avoid moving your furniture around. If any of your floors are uneven, fix them. If there are any pets around you, be aware of where they are. Review your medicines with your doctor. Some medicines can make you feel dizzy. This can increase your chance of falling. Ask your doctor what other things that you can do to help prevent falls. This information is not intended to replace advice given to you by your health care provider. Make sure you discuss any questions you have with your health care provider. Document Released: 08/20/2009 Document Revised: 03/31/2016 Document Reviewed: 11/28/2014 Elsevier Interactive Patient Education  2017 [employer].

## 2023-02-28 ENCOUNTER — Ambulatory Visit: Payer: Medicare Other | Admitting: Podiatry

## 2023-02-28 DIAGNOSIS — M79675 Pain in left toe(s): Secondary | ICD-10-CM

## 2023-02-28 DIAGNOSIS — B351 Tinea unguium: Secondary | ICD-10-CM | POA: Diagnosis not present

## 2023-02-28 DIAGNOSIS — M79674 Pain in right toe(s): Secondary | ICD-10-CM | POA: Diagnosis not present

## 2023-02-28 NOTE — Progress Notes (Signed)
   Chief Complaint  Patient presents with   Nail Problem    Routine foot care, nail trim     SUBJECTIVE Patient presents to office today complaining of elongated, thickened nails that cause pain while ambulating in shoes.  Patient is unable to trim their own nails. Patient is here for further evaluation and treatment.  Past Medical History:  Diagnosis Date   Asthma    Stroke     Allergies  Allergen Reactions   Penicillins      OBJECTIVE General Patient is awake, alert, and oriented x 3 and in no acute distress. Derm Skin is dry and supple bilateral. Negative open lesions or macerations. Remaining integument unremarkable. Nails are tender, long, thickened and dystrophic with subungual debris, consistent with onychomycosis, 1-5 bilateral. No signs of infection noted. Vasc  DP and PT pedal pulses palpable bilaterally. Temperature gradient within normal limits.  Neuro Epicritic and protective threshold sensation grossly intact bilaterally.  Musculoskeletal Exam No symptomatic pedal deformities noted bilateral. Muscular strength within normal limits.  ASSESSMENT 1.  Pain due to onychomycosis of toenails both  PLAN OF CARE 1. Patient evaluated today.  2. Instructed to maintain good pedal hygiene and foot care.  3. Mechanical debridement of nails 1-5 bilaterally performed using a nail nipper. Filed with dremel without incident.  4. Return to clinic in 3 mos.    Felecia Shelling, DPM [facility]  Dr. Felecia Shelling, DPM    [address] [place], [place] [address]                Office [phone]  Fax [phone]

## 2023-03-06 ENCOUNTER — Other Ambulatory Visit: Payer: Self-pay | Admitting: Physician Assistant

## 2023-03-06 DIAGNOSIS — I10 Essential (primary) hypertension: Secondary | ICD-10-CM

## 2023-03-21 ENCOUNTER — Telehealth: Payer: Self-pay

## 2023-03-21 NOTE — Telephone Encounter (Signed)
Received POA paperwork. Labels placed on all pages and placed in medical records box.

## 2023-03-22 ENCOUNTER — Ambulatory Visit: Payer: Medicare Other | Admitting: Neurology

## 2023-03-29 ENCOUNTER — Encounter: Payer: Self-pay | Admitting: Neurology

## 2023-03-29 ENCOUNTER — Ambulatory Visit: Payer: Medicare Other | Admitting: Neurology

## 2023-03-29 VITALS — BP 114/68 | HR 65 | Ht 61.0 in | Wt 112.0 lb

## 2023-03-29 DIAGNOSIS — G301 Alzheimer's disease with late onset: Secondary | ICD-10-CM

## 2023-03-29 DIAGNOSIS — F02818 Dementia in other diseases classified elsewhere, unspecified severity, with other behavioral disturbance: Secondary | ICD-10-CM

## 2023-03-29 MED ORDER — MEMANTINE HCL 10 MG PO TABS: 10.0000 mg | ORAL_TABLET | Freq: Two times a day (BID) | ORAL | 4 refills | Status: DC

## 2023-03-29 MED ORDER — DONEPEZIL HCL 10 MG PO TABS: 10.0000 mg | ORAL_TABLET | Freq: Every day | ORAL | 3 refills | Status: DC

## 2023-03-29 NOTE — Patient Instructions (Addendum)
- Continue Aricept and Namenda.  - Sometimes there is some agitation and aggression but right now manageable. Distraction is good.  - We discussed we can always suggest medications if the hallucinations or delusions get out of hand. Examples include SSRI like celexa for mood, Depakote which we sometimes use for aggression in the eldely or Seroquel which is an anti-psychotic but used often for hallucintaions and delusions that can't be managed.    Alzheimer's Disease Caregiver Guide Alzheimer's disease is a brain disease that causes memory loss and changes in behavior. People with Alzheimer's disease often have problems paying attention, communicating, and doing routine tasks. The disease gets worse over time, and people with the disease eventually need full-time care. Taking care of someone with Alzheimer's disease can be challenging and overwhelming. This guide provides helpful information and tips that can make caring for someone with the disease a little easier. How to help manage lifestyle changes Managing symptoms Be calm and patient. Give short, simple answers to questions. Long answers can overwhelm and confuse the person. Avoid correcting the person in a negative way. Try not to take things personally, even if the person forgets your name. Understand that changes are a part of the disease process. Do not argue or try to convince the person about a specific point. Doing that may make the person feel more agitated. Reducing frustration Make appointments and do daily tasks, like bathing and dressing, when the person is at his or her best. Allow for plenty of time for simple tasks because they may take longer than expected. Take your time when doing these tasks. Limit the person's choices. Too many choices can be overwhelming and stressful for the person. Involve the person in what you are doing. Take steps to help keep things organized such as: Keeping a daily routine. Organizing medicines  in a pillbox for each day of the week. Keeping a calendar in a central location to remind the person of appointments or other activities. Avoid crowds and new situations, if possible. Use simple words, short sentences, and a calm voice. Only give one direction at a time. Buy clothes and shoes for the person that are easy to put on and take off. Try to change the subject or topic if the person becomes frustrated or angry. Distraction is a great technique for making a situation less tense. Reducing the risk of injury  Keep floors clear of clutter. Remove rugs, magazine racks, and floor lamps. Keep hallways well-lit, especially at night. Put a handrail and nonslip mat in the bathtub or shower. Put childproof locks on cabinets that contain dangerous items, such as medicines, alcohol, guns, toxic cleaning items, sharp tools or utensils, matches, and lighters. Put locks on doors. Put the locks in places where the person cannot see or reach them easily. This will help ensure that the person does not wander out of the house and get lost. Be prepared for emergencies. Keep a list of emergency phone numbers and addresses in a convenient area. Remove car keys and lock garage doors so that the person does not try to get in the car and drive. A certain type of bracelet may be worn that tracks a person's location and identifies him or her as having memory problems. This should be worn at all times for safety. Planning for the future  Discuss financial and legal planning early on in the course of the disease. People with Alzheimer's disease will have trouble managing their money as the disease gets worse.  Get help from professional advisers regarding financial and legal matters. Discuss advance directives, safety, and daily care. Take these steps: Create a living will and choose a power of attorney. The person with power of attorney will be able to make decisions for the person with Alzheimer's disease when he  or she is no longer able to. Discuss driving safety and when to stop driving. The person's health care provider can help provide assistance with this decision. Discuss the person's living situation. If the person lives alone, make sure he or she is safe. People who live at home may need extra help from home health caregivers, and those who live in a nursing home or care center may need more care. How to recognize changes in the person's condition Alzheimer's disease causes a person to lose the ability to remember things and make decisions. Memory loss and confusion are usually mild at the start of the disease, but they slowly get more severe over time. Eventually, the person may not recognize friends, family members, or familiar places. Alzheimer's disease can also cause hallucinations, changes in behavior, and changes in mood, such as anxiety or anger. The changes can come on suddenly. They may happen in response to something such as: Pain. An infection. Changes in environment, such as changes in temperature or noise. Overstimulation. Feeling lost or scared. Medicines. Where to find support Ask about respite care resources. Respite care can provide short-term care for the person so that you can have a regular break from the stress of caregiving. Consider joining a local support group. Advantages of being part of a support group include: Learning strategies to manage stress. Sharing experiences with others. Receiving emotional comfort and support. Learning about caregiving as the disease progresses. Knowing what community resources are available and making use of them. Where to find more information Alzheimer's Association: LimitLaws.hu Contact a health care provider if: The person has a fever. The person has a sudden change in behavior that does not improve with calming strategies. The person is unable to manage in his or her current living situation. You are no longer able to care for the  person. Get help right away if: The person has a sudden increase in confusion or new hallucinations. The person threatens himself or herself, you, or anyone else. If you ever feel like your loved one may hurt himself or herself or others, or shares thoughts about taking his or her own life, get help right away. You can go to your nearest emergency department or: Call your local emergency services (911 in the [place] Call a suicide crisis helpline, such as the National Suicide Prevention Lifeline at 872-156-3422 or 988 in the [place] This is open 24 hours a day in the [place] Text the Crisis Text Line at 240-212-7560 (in the [place] Summary Alzheimer's disease is a brain disease that causes memory loss and changes in behavior. People with Alzheimer's disease often have problems paying attention, communicating, and doing routine tasks. The disease gets worse over time, and people with the disease eventually need full-time care. Take steps to reduce the person's risk of injury and plan for future care. Taking care of someone with Alzheimer's disease can be very challenging and overwhelming. One way to find support during this time is to join a local support group. This information is not intended to replace advice given to you by your health care provider. Make sure you discuss any questions you have with your health care provider. Document Revised: 05/19/2021 Document Reviewed: 02/10/2020  Elsevier Patient Education  2023 [employer].

## 2023-03-29 NOTE — Progress Notes (Signed)
[facility]    Provider:  Dr Lucia Gaskins Requesting Provider: Alfredia Ferguson, PA-C Primary Care Provider:  Alfredia Ferguson, PA-C  CC: alzheimers  HPI:  Michele Lawrence is a 77 y.o. female here as requested by Alfredia Ferguson, PA-C for late-stage alzheimer's disease. MMSE 10/30. Already on Aricept and Namenda.    patient with late-stage alzheimer's disease. Was followed for years by [facility]. I unfortunately had a talk with husband that there is no more that we can do for patient except give her a good quality of life. I'm sorry he did not like the NPs at [facility], but we use physician extenders as well. We are happy to follow every 6-12 months but he could also see his pcp for the same. Husband reports Sometimes there is some agitation and aggression but right now manageable. Distraction is good. We discussed we can always suggest medications if the hallucinations or delusions get out of hand.Continue Aricept and Namenda. namenda. Examples include SSRI like celexa for mood, Depakote which we sometimes use for aggression in the eldely or Seroquel which is an anti-psychotic but used often for hallucintaions and delusions that can't be managed.   - Continue Aricept and Namenda.  - Sometimes there is some agitation and aggression but right now manageable. Distraction is good.  - We discussed we can always suggest medications if the hallucinations or delusions get out of hand. Examples include SSRI like celexa for mood, Depakote which we sometimes use for aggression in the eldely or Seroquel which is an anti-psychotic but used often for hallucintaions and delusions that can't be managed.  - talked about caregiver burnout - provided literature on resources in the area  Reviewed notes, labs and imaging from outside physicians, which showed:  Reviewed last neuro note reads: Plan:  HPI: Patient is a 77 y.o. female whose PMH is notable for HTN, HLD, CVA/TIA x2 2017, Appendectomy presents today  for follow up of Alzheimer's Disease.  Cognitive concerns started 2017 with memory changes.   Medications: Continue donepezil (aricept) 10mg  morning for memory Continue memantine (namenda) 10mg  twice a day  M [place]  Review of Systems: Patient complains of symptoms per HPI as well as the following symptoms alzheimers. Pertinent negatives and positives per HPI. All others negative.   Social History   Socioeconomic History   Marital status: Married    Spouse name: Not on file   Number of children: 4   Years of education: Not on file   Highest education level: Some college, no degree  Occupational History   Occupation: retired  Tobacco Use   Smoking status: Never   Smokeless tobacco: Never  Substance and Sexual Activity   Alcohol use: Not Currently   Drug use: No   Sexual activity: Not on file  Other Topics Concern   Not on file  Social History Narrative   Lives at home with husband in 2 story house with their dog Hank   Caffeine: tea, 3 cups/day max   Social Determinants of Health   Financial Resource Strain: Low Risk  (02/22/2023)   Overall Financial Resource Strain (CARDIA)    Difficulty of Paying Living Expenses: Not hard at all  Food Insecurity: No Food Insecurity (02/22/2023)   Hunger Vital Sign    Worried About Running Out of Food in the Last Year: Never true    Ran Out of Food in the Last Year: Never true  Transportation Needs: No Transportation Needs (02/22/2023)   PRAPARE - Transportation    Lack  of Transportation (Medical): No    Lack of Transportation (Non-Medical): No  Physical Activity: Inactive (02/22/2023)   Exercise Vital Sign    Days of Exercise per Week: 0 days    Minutes of Exercise per Session: 0 min  Stress: No Stress Concern Present (02/22/2023)   [employer] of Occupational Health - Occupational Stress Questionnaire    Feeling of Stress : Not at all  Social Connections: Moderately Isolated (02/22/2023)   Social Connection and Isolation  Panel [NHANES]    Frequency of Communication with Friends and Family: More than three times a week    Frequency of Social Gatherings with Friends and Family: Twice a week    Attends Religious Services: Never    Database administrator or Organizations: No    Attends Banker Meetings: Never    Marital Status: Married  Catering manager Violence: Not At Risk (02/22/2023)   Humiliation, Afraid, Rape, and Kick questionnaire    Fear of Current or Ex-Partner: No    Emotionally Abused: No    Physically Abused: No    Sexually Abused: No    Family History  Problem Relation Age of Onset   Cancer Mother        breast   Diabetes Mother    Stroke Mother    CVA Mother    Cataracts Mother    Psoriasis Mother    Breast cancer Mother 76   Memory loss Mother    Alcohol abuse Father    Heart disease Sister     Past Medical History:  Diagnosis Date   Asthma    Stroke [facility])     Patient Active Problem List   Diagnosis Date Noted   History of hyperglycemia 09/17/2021   History of stroke 09/17/2021   Dermatitis 09/17/2021   DNR (do not resuscitate) 04/01/2020   Alzheimer's dementia, late onset, with behavioral disturbance (HCC) 03/02/2020   Syncope 07/19/2018   Weight loss, unintentional 03/09/2016   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 Reported on 03/18/2016     amLODipine (NORVASC) 2.5 MG tablet TAKE 1 TABLET BY MOUTH DAILY 90 tablet 1    triamcinolone ointment (KENALOG) 0.5 % Apply 1 application topically 2 (two) times daily. (Patient taking differently: Apply 1 application  topically 2 (two) times daily. As needed) 30 g 2   donepezil (ARICEPT) 10 MG tablet Take 1 tablet (10 mg total) by mouth daily. Take 1 tablet daily with breakfast 90 tablet 3   memantine (NAMENDA) 10 MG tablet Take 1 tablet (10 mg total) by mouth 2 (two) times daily. 180 tablet 4   No current facility-administered medications for this visit.    Allergies as of 03/29/2023 - Review Complete 03/29/2023  Allergen Reaction Noted   Penicillins  07/17/2015    Vitals: BP 114/68 (BP Location: Right Arm, Patient Position: Sitting)   Pulse 65   Ht 5\' 1"  (1.549 m)   Wt 112 lb (50.8 kg)  BMI 21.16 kg/m  Last Weight:  Wt Readings from Last 1 Encounters:  03/29/23 112 lb (50.8 kg)   Last Height:   Ht Readings from Last 1 Encounters:  03/29/23 5\' 1"  (1.549 m)     Physical exam: Exam: Gen: NAD, not conversant, thin                 CV: . No peripheral edema, warm, nontender. No palpitations noted or chest pain Eyes: Conjunctivae clear without exudates or hemorrhage  Neuro: Detailed Neurologic Exam  Speech:    Speech is normal; fluent and spontaneous with normal comprehension.  Cognition:    03/29/2023    3:14 PM 03/02/2020   10:09 AM  MMSE - Mini Mental State Exam  Orientation to time 0 1  Orientation to Place 1 3  Registration 3 3  Attention/ Calculation 0 4  Recall 0 2  Language- name 2 objects 1 2  Language- repeat 1 1  Language- follow 3 step command 3 3  Language- read & follow direction 1 1  Write a sentence 0 1  Copy design 0 0  Total score 10 21    Cranial Nerves:    The pupils are equal, round, and reactive to light. Pupils too small to visualize fundi. Visual fields appear full, no obvious vision loss. Extraocular movements are intact. Trigeminal sensation appears intact and the muscles of mastication appear normal. The face  is symmetric. Swallowing without any dysfunction apparent in exam room.Marland Kitchen Hearing intact. Voice is normal. Shoulder shrug is normal. The tongue has normal motion without fasciculations.   Coordination: No ataxia noted  Gait: Not parkinsonian not ataxic  Motor Observation:    No asymmetry, no atrophy, and no involuntary movements noted. Tone: Appears normal  Posture:    Posture is stooped    Strength: No focal deficits noted     Sensation: intact to LT     Reflex Exam:  DTR's:    Deep tendon reflexes in the upper and lower extremities without any visualized clonus bilaterally.      Assessment/Plan:  patient with late-stage alzheimer's disease. Was followed for years by [facility]. I unfortunately had a talk with husband that there is no more that we can do for patient except give her a good quality of life. I'm sorry he did not like the NPs at [facility], but we use physician extenders as well. We are happy to follow every 6-12 months but he could also see his pcp for the same.  - Continue Aricept and Namenda.  - Sometimes there is some agitation and aggression but right now manageable. Distraction is good.  - We discussed we can always suggest medications if the hallucinations or delusions get out of hand. Examples include SSRI like celexa for mood, Depakote which we sometimes use for aggression in the eldely or Seroquel which is an anti-psychotic but used often for hallucintaions and delusions that can't be managed.  - talked about caregiver burnout - provided literature on resources in the area  Meds ordered this encounter  Medications   donepezil (ARICEPT) 10 MG tablet    Sig: Take 1 tablet (10 mg total) by mouth daily. Take 1 tablet daily with breakfast    Dispense:  90 tablet    Refill:  3   memantine (NAMENDA) 10 MG tablet    Sig: Take 1 tablet (10 mg total) by mouth 2 (two) times daily.    Dispense:  180 tablet    Refill:  4  Cc: Burnett Corrente,  Alfredia Ferguson,  PA-C  Naomie Dean, MD  [facility] [address] [place], [place] [address]  Phone [phone] Fax [phone]  I spent 30 minutes of face-to-face and non-face-to-face time with patient on the  1. Alzheimer's dementia, late onset, with behavioral disturbance (HCC)    diagnosis.  This included previsit chart review, lab review, study review, order entry, electronic health record documentation, patient education on the different diagnostic and therapeutic options, counseling and coordination of care, risks and benefits of management, compliance, or risk factor reduction

## 2023-05-09 ENCOUNTER — Ambulatory Visit: Payer: Medicare Other | Admitting: Neurology

## 2023-05-30 ENCOUNTER — Encounter: Payer: Self-pay | Admitting: Podiatry

## 2023-05-30 ENCOUNTER — Ambulatory Visit: Payer: Medicare Other | Admitting: Podiatry

## 2023-05-30 VITALS — BP 128/81 | HR 70

## 2023-05-30 DIAGNOSIS — B351 Tinea unguium: Secondary | ICD-10-CM | POA: Diagnosis not present

## 2023-05-30 DIAGNOSIS — M79674 Pain in right toe(s): Secondary | ICD-10-CM

## 2023-05-30 DIAGNOSIS — M79675 Pain in left toe(s): Secondary | ICD-10-CM

## 2023-05-30 NOTE — Progress Notes (Signed)
   Chief Complaint  Patient presents with   Nail Problem    "It's just a trim."    SUBJECTIVE Patient presents to office today complaining of elongated, thickened nails that cause pain while ambulating in shoes.  Patient is unable to trim their own nails. Patient is here for further evaluation and treatment.  Past Medical History:  Diagnosis Date   Asthma    Stroke [facility])     Allergies  Allergen Reactions   Penicillins      OBJECTIVE General Patient is awake, alert, and oriented x 3 and in no acute distress. Derm Skin is dry and supple bilateral. Negative open lesions or macerations. Remaining integument unremarkable. Nails are tender, long, thickened and dystrophic with subungual debris, consistent with onychomycosis, 1-5 bilateral. No signs of infection noted. Vasc  DP and PT pedal pulses palpable bilaterally. Temperature gradient within normal limits.  Neuro Epicritic and protective threshold sensation grossly intact bilaterally.  Musculoskeletal Exam No symptomatic pedal deformities noted bilateral. Muscular strength within normal limits.  ASSESSMENT 1.  Pain due to onychomycosis of toenails both  PLAN OF CARE 1. Patient evaluated today.  2. Instructed to maintain good pedal hygiene and foot care.  3. Mechanical debridement of nails 1-5 bilaterally performed using a nail nipper. Filed with dremel without incident.  4. Return to clinic in 3 mos.    Felecia Shelling, DPM [facility]  Dr. Felecia Shelling, DPM    [address] [place], [place] [address]                Office [phone]  Fax [phone]

## 2023-08-27 ENCOUNTER — Other Ambulatory Visit: Payer: Self-pay | Admitting: Physician Assistant

## 2023-08-27 DIAGNOSIS — I10 Essential (primary) hypertension: Secondary | ICD-10-CM

## 2023-08-31 ENCOUNTER — Ambulatory Visit: Payer: Medicare Other | Admitting: Podiatry

## 2023-09-02 ENCOUNTER — Other Ambulatory Visit: Payer: Self-pay | Admitting: Physician Assistant

## 2023-09-02 DIAGNOSIS — I10 Essential (primary) hypertension: Secondary | ICD-10-CM

## 2023-09-07 ENCOUNTER — Other Ambulatory Visit: Payer: Self-pay | Admitting: Family Medicine

## 2023-09-07 DIAGNOSIS — I10 Essential (primary) hypertension: Secondary | ICD-10-CM

## 2023-09-07 MED ORDER — AMLODIPINE BESYLATE 2.5 MG PO TABS: 2.5000 mg | ORAL_TABLET | Freq: Every day | ORAL | 0 refills | Status: DC

## 2023-09-07 NOTE — Telephone Encounter (Signed)
Courtesy refill given, appointment needed.    Requested Prescriptions  Pending Prescriptions Disp Refills   amLODipine (NORVASC) 2.5 MG tablet 30 tablet 0    Sig: Take 1 tablet (2.5 mg total) by mouth daily. OFFICE VISIT NEEDED FOR ADDITIONAL REFILLS     Cardiovascular: Calcium Channel Blockers 2 Failed - 09/07/2023  4:21 PM      Failed - Valid encounter within last 6 months    Recent Outpatient Visits           1 year ago Toenail deformity   [place] [facility] Alfredia Ferguson, PA-C   1 year ago Primary hypertension   [place] [facility] Alfredia Ferguson, PA-C   1 year ago Primary hypertension   [place] [facility] Alfredia Ferguson, PA-C   3 years ago Dementia without behavioral disturbance, unspecified dementia type [facility])   [place] [facility] Osvaldo Angst M, [place]   3 years ago Muscle tightness    [place] [facility] [place], Marzella Schlein, MD       Future Appointments             In 6 days Jacky Kindle, FNP [facility], [facility]            Passed - Last BP in normal range    BP Readings from Last 1 Encounters:  05/30/23 128/81         Passed - Last Heart Rate in normal range    Pulse Readings from Last 1 Encounters:  05/30/23 70

## 2023-09-07 NOTE — Telephone Encounter (Signed)
Medication Refill - Medication: amlodipine 2.5mg  Refill was denied for appt. Pt now has appt for 11/06 at 3pm  Has the patient contacted their pharmacy? yes (Agent: If yes, when and what did the pharmacy advise?)contact pcp  Preferred Pharmacy (with phone number or street name):  [id number] [place], [place] - [address] [place], [place] [address] Phone: [phone]  Fax: [phone]    Has the patient been seen for an appointment in the last year OR does the patient have an upcoming appointment? yes  Agent: Please be advised that RX refills may take up to 3 business days. We ask that you follow-up with your pharmacy.

## 2023-09-11 ENCOUNTER — Ambulatory Visit: Payer: Medicare Other | Admitting: Podiatry

## 2023-09-11 DIAGNOSIS — M79675 Pain in left toe(s): Secondary | ICD-10-CM | POA: Diagnosis not present

## 2023-09-11 DIAGNOSIS — B351 Tinea unguium: Secondary | ICD-10-CM

## 2023-09-11 DIAGNOSIS — M79674 Pain in right toe(s): Secondary | ICD-10-CM

## 2023-09-11 NOTE — Progress Notes (Unsigned)
  Subjective:  Patient ID: Michele Lawrence, female    DOB: 1946-03-19,  MRN: [id number]  77 y.o. female presents painful elongated mycotic toenails 1-5 bilaterally which are tender when wearing enclosed shoe gear. Pain is relieved with periodic professional debridement. Patient has h/o Alzheimers and is accompanied by  New problem(s): None   PCP is Jacky Kindle, FNP  Allergies  Allergen Reactions   Penicillins    Review of Systems: Negative except as noted in the HPI.   Objective:  Michele Lawrence is a pleasant 77 y.o. female thin build in NAD.Marland Kitchen AAO x 3.  Vascular Examination: Vascular status intact b/l with palpable pedal pulses. CFT immediate b/l. Pedal hair present. No edema. No pain with calf compression b/l. Skin temperature gradient WNL b/l. No varicosities noted. No cyanosis or clubbing noted.  Neurological Examination: Sensation grossly intact b/l with 10 gram monofilament. Vibratory sensation intact b/l.  Dermatological Examination: Pedal skin with normal turgor, texture and tone b/l. No open wounds nor interdigital macerations noted. Toenails 1-5 b/l thick, discolored, elongated with subungual debris and pain on dorsal palpation. No hyperkeratotic lesions noted b/l.   Musculoskeletal Examination: Muscle strength 5/5 to b/l LE.  No pain, crepitus noted b/l. No gross pedal deformities. Patient ambulates independently without assistive aids.   Radiographs: None  Last A1c:       No data to display         Assessment:   1. Pain due to onychomycosis of toenails of both feet    Plan:  -Patient with h/o dementia/Alzheimer's/cognitive deficit. Patient's family member present. All questions/concerns addressed on today's visit. -Patient to continue soft, supportive shoe gear daily. -Toenails 1-5 b/l were debrided in length and girth with sterile nail nippers and dremel without iatrogenic bleeding.  -Patient/POA to call should there be question/concern in the  interim.  Return in about 3 months (around 12/12/2023).  Freddie Breech, DPM

## 2023-09-13 ENCOUNTER — Encounter: Payer: Self-pay | Admitting: Family Medicine

## 2023-09-13 ENCOUNTER — Ambulatory Visit: Payer: Medicare Other | Admitting: Family Medicine

## 2023-09-13 VITALS — BP 111/56 | HR 72 | Ht 62.0 in | Wt 109.5 lb

## 2023-09-13 DIAGNOSIS — G301 Alzheimer's disease with late onset: Secondary | ICD-10-CM | POA: Diagnosis not present

## 2023-09-13 DIAGNOSIS — I10 Essential (primary) hypertension: Secondary | ICD-10-CM | POA: Diagnosis not present

## 2023-09-13 MED ORDER — MEMANTINE HCL 10 MG PO TABS: 10.0000 mg | ORAL_TABLET | Freq: Two times a day (BID) | ORAL | 4 refills | Status: DC

## 2023-09-13 MED ORDER — DONEPEZIL HCL 10 MG PO TABS: 10.0000 mg | ORAL_TABLET | Freq: Every day | ORAL | 4 refills | Status: DC

## 2023-09-13 MED ORDER — CITALOPRAM HYDROBROMIDE 10 MG PO TABS: 10.0000 mg | ORAL_TABLET | Freq: Every day | ORAL | 11 refills | Status: DC

## 2023-09-13 MED ORDER — QUETIAPINE FUMARATE 25 MG PO TABS: 25.0000 mg | ORAL_TABLET | Freq: Every day | ORAL | 11 refills | Status: DC

## 2023-09-13 MED ORDER — AMLODIPINE BESYLATE 2.5 MG PO TABS: 2.5000 mg | ORAL_TABLET | Freq: Every day | ORAL | 4 refills | Status: DC

## 2023-09-13 NOTE — Assessment & Plan Note (Signed)
Chronic, stable Continue low dose norvasc at 2.5 mg No swelling to LE on exam SO declines labs at this time iso hallucinations/delusions

## 2023-09-13 NOTE — Assessment & Plan Note (Signed)
Chronic, worsening Trial of celexa and seroquel to assist previous aricept 10 mg and namenda 10 mg BID Recommend 6 week schedule No travel at this time d/t stress Active delusions during exam ACP in place; lives at home with SO and 79 year old terrier mix, Hank

## 2023-09-13 NOTE — Progress Notes (Signed)
Established patient visit   [name]   DOB: [birth year]   77 y.o. Female  MRN: [id number] Visit Date: 09/13/2023  Today's healthcare provider: Jacky Kindle, FNP  Introduced to nurse practitioner role and practice setting.  All questions answered.  Discussed provider/patient relationship and expectations.  Chief Complaint  Patient presents with   Medication Refill    Patient is needing refill on amlodipine. Courtesy given 09/07/23. Patients husband would like provider to take over all medications    Medical Management of Chronic Issues    Last formal OV 13/13/22. Htn addressed informally on 08/16/22 and advised to continue current medications. Patient husband reports patient alzheimer's is getting worse   Subjective    Medication Refill   HPI     Medication Refill    Additional comments: Patient is needing refill on amlodipine. Courtesy given 09/07/23. Patients husband would like provider to take over all medications         Medical Management of Chronic Issues    Additional comments: Last formal OV 13/13/22. Htn addressed informally on 08/16/22 and advised to continue current medications. Patient husband reports patient alzheimer's is getting worse      Last edited by Acey Lav, CMA on 09/13/2023  2:56 PM.      Medications: Outpatient Medications Prior to Visit  Medication Sig   acetaminophen (TYLENOL) 500 MG tablet Take 500 mg by mouth every 6 (six) hours as needed for moderate pain or headache. Reported on 03/18/2016   [DISCONTINUED] amLODipine (NORVASC) 2.5 MG tablet Take 1 tablet (2.5 mg total) by mouth daily. OFFICE VISIT NEEDED FOR ADDITIONAL REFILLS   [DISCONTINUED] donepezil (ARICEPT) 10 MG tablet Take 1 tablet (10 mg total) by mouth daily. Take 1 tablet daily with breakfast   [DISCONTINUED] memantine (NAMENDA) 10 MG tablet Take 1 tablet (10 mg total) by mouth 2 (two) times daily.   triamcinolone ointment (KENALOG) 0.5 % Apply 1  application topically 2 (two) times daily. (Patient not taking: Reported on 09/13/2023)   No facility-administered medications prior to visit.     Objective    BP (!) 111/56 (BP Location: Right Arm, Patient Position: Sitting, Cuff Size: Normal)   Pulse 72   Ht 5\' 2"  (1.575 m)   Wt 109 lb 8 oz (49.7 kg)   SpO2 100%   BMI 20.03 kg/m   Physical Exam Vitals and nursing note reviewed.  Constitutional:      General: She is not in acute distress.    Appearance: Normal appearance. She is normal weight. She is not ill-appearing, toxic-appearing or diaphoretic.  HENT:     Head: Normocephalic and atraumatic.  Cardiovascular:     Rate and Rhythm: Normal rate and regular rhythm.     Pulses: Normal pulses.     Heart sounds: Normal heart sounds. No murmur heard.    No friction rub. No gallop.  Pulmonary:     Effort: Pulmonary effort is normal. No respiratory distress.     Breath sounds: Normal breath sounds. No stridor. No wheezing, rhonchi or rales.  Chest:     Chest wall: No tenderness.  Musculoskeletal:        General: No swelling, tenderness, deformity or signs of injury. Normal range of motion.     Right lower leg: No edema.     Left lower leg: No edema.  Skin:    General: Skin is warm and dry.     Capillary Refill: Capillary refill takes less than 2  seconds.     Coloration: Skin is not jaundiced or pale.     Findings: No bruising, erythema, lesion or rash.  Neurological:     Mental Status: She is alert. She is disoriented.     Cranial Nerves: No cranial nerve deficit.     Sensory: No sensory deficit.     Motor: Weakness present.     Coordination: Coordination normal.     Gait: Gait abnormal.  Psychiatric:        Attention and Perception: Attention normal. She perceives visual hallucinations.        Mood and Affect: Affect is flat and inappropriate.        Speech: Speech is delayed.        Behavior: Behavior is cooperative.        Thought Content: Thought content is  delusional.        Cognition and Memory: Cognition is impaired. Memory is impaired. She exhibits impaired recent memory and impaired remote memory.     Comments: Patient with active delusions that a dog is jumping at her R hip and trying to bite her during OV     No results found for any visits on 09/13/23.  Assessment & Plan     Problem List Items Addressed This Visit       Cardiovascular and Mediastinum   Hypertension    Chronic, stable Continue low dose norvasc at 2.5 mg No swelling to LE on exam SO declines labs at this time iso hallucinations/delusions       Relevant Medications   amLODipine (NORVASC) 2.5 MG tablet     Nervous and Auditory   Alzheimer's dementia, late onset, with behavioral disturbance (HCC) - Primary    Chronic, worsening Trial of celexa and seroquel to assist previous aricept 10 mg and namenda 10 mg BID Recommend 6 week schedule No travel at this time d/t stress Active delusions during exam ACP in place; lives at home with SO and 79 year old terrier mix, Hank       Relevant Medications   donepezil (ARICEPT) 10 MG tablet   memantine (NAMENDA) 10 MG tablet   QUEtiapine (SEROQUEL) 25 MG tablet   citalopram (CELEXA) 10 MG tablet   Return in about 6 weeks (around 10/25/2023) for celexa/seroquel start .     Leilani Merl, FNP, have reviewed all documentation for this visit. The documentation on 09/13/23 for the exam, diagnosis, procedures, and orders are all accurate and complete.  Jacky Kindle, FNP  [facility] [phone] (phone) [phone] (fax)  [facility]

## 2023-10-25 ENCOUNTER — Ambulatory Visit (INDEPENDENT_AMBULATORY_CARE_PROVIDER_SITE_OTHER): Payer: Medicare Other | Admitting: Family Medicine

## 2023-10-25 VITALS — BP 110/69 | HR 69 | Temp 97.2°F | Resp 14 | Ht 62.0 in | Wt 109.2 lb

## 2023-10-25 DIAGNOSIS — G301 Alzheimer's disease with late onset: Secondary | ICD-10-CM

## 2023-10-25 DIAGNOSIS — F02818 Dementia in other diseases classified elsewhere, unspecified severity, with other behavioral disturbance: Secondary | ICD-10-CM | POA: Diagnosis not present

## 2023-11-03 ENCOUNTER — Encounter: Payer: Self-pay | Admitting: Family Medicine

## 2023-11-03 NOTE — Assessment & Plan Note (Signed)
Chronic, worsening Trial of celexa and seroquel to assist previous aricept 10 mg and namenda 10 mg BID; pt notes that they did NOT start seroquel following reading possible side effects Recommend 6 week schedule following start and f/u with new PCP No travel at this time d/t stress- lives with SO and family near by At previous appts active delusions during exam ACP in place; lives at home with SO and 79 year old terrier mix, [employer]

## 2023-11-03 NOTE — Progress Notes (Signed)
Established patient visit   [name]   DOB: April 20, 1946   77 y.o. Female  MRN: [id number] Visit Date: 10/25/2023  Today's healthcare provider: Jacky Kindle, FNP  Introduced to nurse practitioner role and practice setting.  All questions answered.  Discussed provider/patient relationship and expectations.  Chief Complaint  Patient presents with   Follow-up    Possible concerns for prescription QUEtiapine (SEROQUEL) 25 MG tablet other medications seem fine the last 6 weeks   Subjective    HPI HPI     Follow-up    Additional comments: Possible concerns for prescription QUEtiapine (SEROQUEL) 25 MG tablet other medications seem fine the last 6 weeks      Last edited by Clois Comber on 10/25/2023  2:04 PM.      Medications: Outpatient Medications Prior to Visit  Medication Sig   acetaminophen (TYLENOL) 500 MG tablet Take 500 mg by mouth every 6 (six) hours as needed for moderate pain or headache. Reported on 03/18/2016   amLODipine (NORVASC) 2.5 MG tablet Take 1 tablet (2.5 mg total) by mouth daily.   citalopram (CELEXA) 10 MG tablet Take 1 tablet (10 mg total) by mouth daily.   donepezil (ARICEPT) 10 MG tablet Take 1 tablet (10 mg total) by mouth daily. Take 1 tablet daily with breakfast   memantine (NAMENDA) 10 MG tablet Take 1 tablet (10 mg total) by mouth 2 (two) times daily.   QUEtiapine (SEROQUEL) 25 MG tablet Take 1 tablet (25 mg total) by mouth at bedtime.   triamcinolone ointment (KENALOG) 0.5 % Apply 1 application topically 2 (two) times daily. (Patient not taking: Reported on 10/25/2023)   No facility-administered medications prior to visit.   Last CBC Lab Results  Component Value Date   WBC 4.1 09/20/2021   HGB 12.8 09/20/2021   HCT 41.0 09/20/2021   MCV 82 09/20/2021   MCH 25.5 (L) 09/20/2021   RDW 13.1 09/20/2021   PLT 227 09/20/2021   Last metabolic panel Lab Results  Component Value Date   GLUCOSE 84 09/20/2021   NA 144  09/20/2021   K 4.3 09/20/2021   CL 108 (H) 09/20/2021   CO2 21 09/20/2021   BUN 17 09/20/2021   CREATININE 0.93 09/20/2021   EGFR 64 09/20/2021   CALCIUM 9.1 09/20/2021   PROT 6.7 09/20/2021   ALBUMIN 4.3 09/20/2021   LABGLOB 2.4 09/20/2021   AGRATIO 1.8 09/20/2021   BILITOT 0.5 09/20/2021   ALKPHOS 108 09/20/2021   AST 25 09/20/2021   ALT 9 09/20/2021   ANIONGAP 6 06/16/2017   Last lipids Lab Results  Component Value Date   CHOL 211 (H) 09/20/2021   HDL 54 09/20/2021   LDLCALC 143 (H) 09/20/2021   TRIG 79 09/20/2021   CHOLHDL 3.9 09/20/2021   Last hemoglobin A1c Lab Results  Component Value Date   HGBA1C 5.7 (H) 09/20/2021   Last thyroid functions Lab Results  Component Value Date   TSH 5.450 (H) 09/20/2021   Last vitamin D No results found for: "25OHVITD2", "25OHVITD3", "VD25OH" Last vitamin B12 and Folate Lab Results  Component Value Date   VITAMINB12 942 08/14/2018     Objective    BP 110/69 (BP Location: Left Arm, Cuff Size: Normal)   Pulse 69   Temp (!) 97.2 F (36.2 C)   Resp 14   Ht 5\' 2"  (1.575 m)   Wt 109 lb 3.2 oz (49.5 kg)   SpO2 98%   BMI 19.97  kg/m   BP Readings from Last 3 Encounters:  10/25/23 110/69  09/13/23 (!) 111/56  05/30/23 128/81   Wt Readings from Last 3 Encounters:  10/25/23 109 lb 3.2 oz (49.5 kg)  09/13/23 109 lb 8 oz (49.7 kg)  03/29/23 112 lb (50.8 kg)   SpO2 Readings from Last 3 Encounters:  10/25/23 98%  09/13/23 100%  08/16/22 97%   Physical Exam Vitals and nursing note reviewed.  Constitutional:      General: She is not in acute distress.    Appearance: Normal appearance. She is normal weight. She is not ill-appearing, toxic-appearing or diaphoretic.  HENT:     Head: Normocephalic and atraumatic.  Cardiovascular:     Rate and Rhythm: Normal rate and regular rhythm.     Pulses: Normal pulses.     Heart sounds: Normal heart sounds. No murmur heard.    No friction rub. No gallop.  Pulmonary:      Effort: Pulmonary effort is normal. No respiratory distress.     Breath sounds: Normal breath sounds. No stridor. No wheezing, rhonchi or rales.  Chest:     Chest wall: No tenderness.  Musculoskeletal:        General: No swelling, tenderness, deformity or signs of injury. Normal range of motion.     Right lower leg: No edema.     Left lower leg: No edema.  Skin:    General: Skin is warm and dry.     Capillary Refill: Capillary refill takes less than 2 seconds.     Coloration: Skin is not jaundiced or pale.     Findings: No bruising, erythema, lesion or rash.  Neurological:     Mental Status: She is alert. Mental status is at baseline.     Cranial Nerves: No cranial nerve deficit.     Sensory: No sensory deficit.     Motor: No weakness.     Coordination: Coordination normal.  Psychiatric:        Behavior: Behavior normal.        Thought Content: Thought content is delusional.        Cognition and Memory: Cognition is impaired. Memory is impaired. She exhibits impaired recent memory and impaired remote memory.     No results found for any visits on 10/25/23.  Assessment & Plan     Problem List Items Addressed This Visit       Nervous and Auditory   Alzheimer's dementia, late onset, with behavioral disturbance (HCC) - Primary   Chronic, worsening Trial of celexa and seroquel to assist previous aricept 10 mg and namenda 10 mg BID; pt notes that they did NOT start seroquel following reading possible side effects Recommend 6 week schedule following start and f/u with new PCP No travel at this time d/t stress- lives with SO and family near by At previous appts active delusions during exam ACP in place; lives at home with SO and 79 year old terrier mix, Hank       Return in about 6 weeks (around 12/06/2023) for anxiety and depression.     Leilani Merl, FNP, have reviewed all documentation for this visit. The documentation on 11/03/23 for the exam, diagnosis, procedures, and  orders are all accurate and complete.  Jacky Kindle, FNP  [facility] [phone] (phone) [phone] (fax)  [facility]

## 2023-11-06 ENCOUNTER — Telehealth: Payer: Self-pay | Admitting: Family Medicine

## 2023-11-06 NOTE — Telephone Encounter (Signed)
I spoke to [place] at [employer] and approved a change to 90 day supply for both medications.

## 2023-11-06 NOTE — Telephone Encounter (Signed)
Patients husband is requesting 90 tabs instead of 30 tabs for her prescription of Quetiapine 25 mg and Citalopram 10 mg.   Please call Karin Golden for change in number of tabs

## 2023-12-12 ENCOUNTER — Telehealth: Payer: Self-pay | Admitting: Family Medicine

## 2023-12-12 NOTE — Telephone Encounter (Signed)
Husband dropped of DMV handicap form to be completed. Sending this back today. Call husb when ready  Melanee Spry [phone]

## 2023-12-14 ENCOUNTER — Ambulatory Visit: Payer: Medicare Other | Admitting: Podiatry

## 2023-12-14 ENCOUNTER — Encounter: Payer: Self-pay | Admitting: Podiatry

## 2023-12-14 VITALS — Ht 62.0 in | Wt 109.2 lb

## 2023-12-14 DIAGNOSIS — B351 Tinea unguium: Secondary | ICD-10-CM

## 2023-12-14 DIAGNOSIS — M79675 Pain in left toe(s): Secondary | ICD-10-CM

## 2023-12-14 DIAGNOSIS — M79674 Pain in right toe(s): Secondary | ICD-10-CM

## 2023-12-22 ENCOUNTER — Encounter: Payer: Self-pay | Admitting: Podiatry

## 2023-12-22 NOTE — Progress Notes (Signed)
  Subjective:  Patient ID: Michele Lawrence, female    DOB: [birth year],  MRN: [id number]  78 y.o. female presents to clinic today with painful, elongated thickened toenails x 10 which are symptomatic when wearing enclosed shoe gear. This interferes with his/her daily activities. Chief Complaint  Patient presents with   Nail Problem    Pt is here for [facility] PCP is Dr Michele and LOV was in December.    Patient has h/o dementia and is accompanied by her husband on today's visit.  New pedal problem(s): None   PCP is Michele Lauraine SAILOR, DO.  Allergies  Allergen Reactions   Penicillins    Review of Systems: Negative except as noted in the HPI.   Objective:  Michele Lawrence is a pleasant 78 y.o. female in NAD.Michele Lawrence   Vascular Examination: Vascular status intact b/l with palpable pedal pulses. CFT immediate b/l. No edema. No pain with calf compression b/l. Skin temperature gradient WNL b/l.   Neurological Examination: Sensation grossly intact b/l with 10 gram monofilament.  Dermatological Examination: Pedal skin with normal turgor, texture and tone b/l. Toenails 1-5 b/l thick, discolored, elongated with subungual debris and pain on dorsal palpation. No hyperkeratotic lesions noted b/l.   Musculoskeletal Examination: Muscle strength 5/5 to b/l LE. Patient ambulates independent of any assistive aids.  Radiographs: None  Last A1c:       No data to display           Assessment:   1. Pain due to onychomycosis of toenails of both feet    Plan:  -Patient's family member present. All questions/concerns addressed on today's visit. -Patient to continue soft, supportive shoe gear daily. -Mycotic toenails 1-5 bilaterally were debrided in length and girth with sterile nail nippers and dremel without incident. -Patient/POA to call should there be question/concern in the interim.  Return in about 3 months (around 03/12/2024).  Michele Lawrence, DPM      [place] LOCATION: [address], [place] [address]                   Office [phone]   [facility] LOCATION: [address] [place], [place] [address] Office [phone]

## 2024-01-01 ENCOUNTER — Ambulatory Visit: Payer: Medicare Other | Admitting: Adult Health

## 2024-01-08 ENCOUNTER — Encounter: Payer: Self-pay | Admitting: Family Medicine

## 2024-01-08 ENCOUNTER — Ambulatory Visit (INDEPENDENT_AMBULATORY_CARE_PROVIDER_SITE_OTHER): Payer: Self-pay | Admitting: Family Medicine

## 2024-01-08 VITALS — BP 113/49 | HR 73 | Resp 16 | Wt 107.0 lb

## 2024-01-08 DIAGNOSIS — E7800 Pure hypercholesterolemia, unspecified: Secondary | ICD-10-CM

## 2024-01-08 DIAGNOSIS — G301 Alzheimer's disease with late onset: Secondary | ICD-10-CM

## 2024-01-08 DIAGNOSIS — Z7409 Other reduced mobility: Secondary | ICD-10-CM | POA: Diagnosis not present

## 2024-01-08 DIAGNOSIS — R5381 Other malaise: Secondary | ICD-10-CM | POA: Diagnosis not present

## 2024-01-08 DIAGNOSIS — Z8673 Personal history of transient ischemic attack (TIA), and cerebral infarction without residual deficits: Secondary | ICD-10-CM

## 2024-01-08 DIAGNOSIS — N182 Chronic kidney disease, stage 2 (mild): Secondary | ICD-10-CM | POA: Diagnosis not present

## 2024-01-08 DIAGNOSIS — F02818 Dementia in other diseases classified elsewhere, unspecified severity, with other behavioral disturbance: Secondary | ICD-10-CM

## 2024-01-08 DIAGNOSIS — E038 Other specified hypothyroidism: Secondary | ICD-10-CM | POA: Diagnosis not present

## 2024-01-08 DIAGNOSIS — M810 Age-related osteoporosis without current pathological fracture: Secondary | ICD-10-CM | POA: Diagnosis not present

## 2024-01-08 DIAGNOSIS — I10 Essential (primary) hypertension: Secondary | ICD-10-CM | POA: Diagnosis not present

## 2024-01-08 NOTE — Assessment & Plan Note (Addendum)
 Tolerates walking approx. 200 ft before having to stop and rest. Addressed as noted above.

## 2024-01-08 NOTE — Progress Notes (Signed)
 Established patient visit   [name]   DOB: [birth year]   78 y.o. Female  MRN: [id number] Visit Date: 01/08/2024  Today's healthcare provider: Sherlyn Hay, DO   Chief Complaint  Patient presents with   Medical Management of Chronic Issues   Subjective    HPI Michele Lawrence is a 78 year old female who presents for a meet and greet to establish care. She is accompanied by her husband, Finn Altemose, who is her primary caregiver.  She has no specific concerns today and states that her health and appetite are good. She is interested in discussing Medicare services and appreciates assistance with paperwork for handicapped parking, which has been helpful as walking has become more difficult.  She has a history of strokes, with the most recent one occurring in 2022, requiring a hospital stay of three to four days. Since then, she has experienced increasing difficulty with walking, managing about 200 feet before needing to rest. Her husband notes increased puffing during exertion. She has not required home care services and manages with the support of her husband and family.  Her husband reports that she was previously overweight at nearly 200 pounds but lost a significant amount of weight rapidly around the time of her stroke, which caused concern due to a family history of cancer. However, all tests were negative, and she is currently stable. She has a family history of cancer, including a daughter who had breast cancer and underwent a double mastectomy.  She is up to date on vaccinations, including the shingles vaccine, which she received after having shingles a few years ago, and flu shots. Her tetanus vaccine was last recorded in 2013, and she has not had a bone density scan or mammogram in recent years.  Her current medications include citalopram and quetiapine, which she takes regularly with assistance from her husband. He notes that she was previously aggressive  but has since calmed down. She requires assistance with daily activities such as bathing, dressing, and feeding, and engages in activities like tearing up paper for stimulation.  No recent colds, flu, or other illnesses. She experiences difficulty walking, which may be related to pain or circulation issues from sitting with her legs crossed for extended periods.     Medications: Outpatient Medications Prior to Visit  Medication Sig   acetaminophen (TYLENOL) 500 MG tablet Take 500 mg by mouth every 6 (six) hours as needed for moderate pain or headache. Reported on 03/18/2016   amLODipine (NORVASC) 2.5 MG tablet Take 1 tablet (2.5 mg total) by mouth daily.   citalopram (CELEXA) 10 MG tablet Take 1 tablet (10 mg total) by mouth daily.   donepezil (ARICEPT) 10 MG tablet Take 1 tablet (10 mg total) by mouth daily. Take 1 tablet daily with breakfast   memantine (NAMENDA) 10 MG tablet Take 1 tablet (10 mg total) by mouth 2 (two) times daily.   QUEtiapine (SEROQUEL) 25 MG tablet Take 1 tablet (25 mg total) by mouth at bedtime.   No facility-administered medications prior to visit.        Objective    BP (!) 113/49 (BP Location: Right Arm, Patient Position: Sitting, Cuff Size: Small)   Pulse 73   Resp 16   Wt 107 lb (48.5 kg)   BMI 19.57 kg/m     Physical Exam Constitutional:      Appearance: Normal appearance.  HENT:     Head: Normocephalic and atraumatic.  Eyes:  General: No scleral icterus.    Extraocular Movements: Extraocular movements intact.     Conjunctiva/sclera: Conjunctivae normal.  Cardiovascular:     Rate and Rhythm: Normal rate and regular rhythm.     Pulses: Normal pulses.     Heart sounds: Normal heart sounds.  Pulmonary:     Effort: Pulmonary effort is normal. No respiratory distress.     Breath sounds: Normal breath sounds.  Abdominal:     General: Bowel sounds are normal. There is no distension.     Palpations: Abdomen is soft. There is no mass.      Tenderness: There is no abdominal tenderness. There is no guarding.  Musculoskeletal:     Right lower leg: No edema.     Left lower leg: No edema.  Skin:    General: Skin is warm and dry.  Neurological:     Mental Status: She is alert and oriented to person, place, and time. Mental status is at baseline.  Psychiatric:        Mood and Affect: Mood normal.        Behavior: Behavior normal.      No results found for any visits on 01/08/24.  Assessment & Plan    Alzheimer's dementia, late onset, with behavioral disturbance (HCC) Assessment & Plan: Husband/caregiver has concerns regarding her ability to walk beyond 200 ft and would like to pursue lightweight wheelchair if possible. Patient would not be able to use a cane or walker due to her Alzheimer's, as she would forget to use them. Zanyla is on citalopram and quetiapine, which have been effective in managing her symptoms. Her husband, who is her caregiver, reports significant improvement in her behavior. Discussed the potential risks and benefits of continuing current medications, and the caregiver is comfortable with the current regimen.   - Continue citalopram and quetiapine   - Monitor for any changes in behavior or symptoms   - Provide caregiver support and resources as needed    Orders: -     For home use only DME lightweight manual wheelchair with seat cushion  Primary hypertension Assessment & Plan: Well-controlled. Continue amlodipine 2.5 mg daily.   Subclinical hypothyroidism Assessment & Plan: Noted. No acute concerns. Continue to monitor.   Chronic kidney disease, stage 2, mildly decreased GFR Assessment & Plan: Noted.  No acute concerns.  Continue to monitor.    Impaired mobility Assessment & Plan: Shonice is experiencing increasing difficulty with walking, likely due to a combination of her history of strokes and potential pain from prolonged sitting. She can walk approximately 200 feet but becomes short of  breath and fatigued. The caregiver is considering a lightweight wheelchair to aid in mobility. Discussed the potential benefits and drawbacks of a lightweight wheelchair, including ease of use and potential for it to be cumbersome and underutilized.   - Discuss the potential benefits and drawbacks of a lightweight wheelchair with the caregiver   - Ordered lightweight wheelchair as noted.   Physical deconditioning Assessment & Plan: Tolerates walking approx. 200 ft before having to stop and rest. Addressed as noted above.  Orders: -     For home use only DME lightweight manual wheelchair with seat cushion  History of ischemic stroke Assessment & Plan: Cammie has a history of multiple strokes, with the most recent one occurring in 2022. Post-stroke, she has been provided with various assistive devices, but they were not utilized. The caregiver (her husband) prefers to manage care independently to avoid disruption.   -  Continue current medications and monitor for any new symptoms or complications    Orders: -     For home use only DME lightweight manual wheelchair with seat cushion  Age-related osteoporosis without current pathological fracture -     DG Bone Density; Future  General Health Maintenance   Anaka is up to date on her vaccinations, including shingles and flu shots. Her tetanus vaccine is overdue, and she has not had a bone density scan or mammogram in recent years. Discussed the potential benefits and limitations of mammograms and bone density scans with the caregiver.   - Order bone density scan   - Recommend tetanus vaccine   - Discuss the potential benefits and limitations of mammograms and other screenings with the caregiver     Return if symptoms worsen or fail to improve.      I discussed the assessment and treatment plan with the patient  The patient was provided an opportunity to ask questions and all were answered. The patient agreed with the plan and  demonstrated an understanding of the instructions.   The patient was advised to call back or seek an in-person evaluation if the symptoms worsen or if the condition fails to improve as anticipated.    Sherlyn Hay, DO  [facility] [phone] (phone) [phone] (fax)  [facility]

## 2024-01-08 NOTE — Assessment & Plan Note (Addendum)
 Husband/caregiver has concerns regarding her ability to walk beyond 200 ft and would like to pursue lightweight wheelchair if possible. Patient would not be able to use a cane or walker due to her Alzheimer's, as she would forget to use them. Seeley is on citalopram and quetiapine, which have been effective in managing her symptoms. Her husband, who is her caregiver, reports significant improvement in her behavior. Discussed the potential risks and benefits of continuing current medications, and the caregiver is comfortable with the current regimen.   - Continue citalopram and quetiapine   - Monitor for any changes in behavior or symptoms   - Provide caregiver support and resources as needed

## 2024-01-08 NOTE — Patient Instructions (Signed)
 Please call the [facility] [phone]) to schedule a routine screening bone density test.

## 2024-01-14 DIAGNOSIS — Z7409 Other reduced mobility: Secondary | ICD-10-CM | POA: Insufficient documentation

## 2024-01-14 NOTE — Assessment & Plan Note (Signed)
Well controlled. Continue amlodipine 2.5 mg daily.  

## 2024-01-14 NOTE — Assessment & Plan Note (Addendum)
 Michele Lawrence is experiencing increasing difficulty with walking, likely due to a combination of her history of strokes and potential pain from prolonged sitting. She can walk approximately 200 feet but becomes short of breath and fatigued. The caregiver is considering a lightweight wheelchair to aid in mobility. Discussed the potential benefits and drawbacks of a lightweight wheelchair, including ease of use and potential for it to be cumbersome and underutilized.   - Discuss the potential benefits and drawbacks of a lightweight wheelchair with the caregiver   - Ordered lightweight wheelchair as noted.

## 2024-01-14 NOTE — Assessment & Plan Note (Signed)
 Noted.  No acute concerns.  Continue to monitor.

## 2024-01-14 NOTE — Assessment & Plan Note (Signed)
 Michele Lawrence has a history of multiple strokes, with the most recent one occurring in 2022. Post-stroke, she has been provided with various assistive devices, but they were not utilized. The caregiver (her husband) prefers to manage care independently to avoid disruption.   - Continue current medications and monitor for any new symptoms or complications

## 2024-02-20 ENCOUNTER — Ambulatory Visit
Admission: RE | Admit: 2024-02-20 | Discharge: 2024-02-20 | Disposition: A | Discharge status: Home / Self Care | Source: Ambulatory Visit | Attending: Family Medicine | Admitting: Family Medicine

## 2024-02-20 DIAGNOSIS — M810 Age-related osteoporosis without current pathological fracture: Secondary | ICD-10-CM | POA: Diagnosis not present

## 2024-02-20 DIAGNOSIS — Z780 Asymptomatic menopausal state: Secondary | ICD-10-CM | POA: Diagnosis not present

## 2024-02-29 ENCOUNTER — Encounter: Payer: Self-pay | Admitting: Family Medicine

## 2024-03-21 ENCOUNTER — Encounter: Payer: Self-pay | Admitting: Podiatry

## 2024-03-21 ENCOUNTER — Ambulatory Visit: Payer: Medicare Other | Admitting: Podiatry

## 2024-03-21 DIAGNOSIS — M79675 Pain in left toe(s): Secondary | ICD-10-CM | POA: Diagnosis not present

## 2024-03-21 DIAGNOSIS — M79674 Pain in right toe(s): Secondary | ICD-10-CM | POA: Diagnosis not present

## 2024-03-21 DIAGNOSIS — B351 Tinea unguium: Secondary | ICD-10-CM

## 2024-03-21 NOTE — Progress Notes (Signed)
  Subjective:  Patient ID: Michele Lawrence, female    DOB: 10/13/1946,  MRN: [id number]  78 y.o. female presents painful elongated mycotic toenails 1-5 bilaterally which are tender when wearing enclosed shoe gear. Pain is relieved with periodic professional debridement.  Chief Complaint  Patient presents with   Nail Problem    "Trim her toenails."   New problem(s): None   PCP is Carlean Charter, DO , and last visit was January 08, 2024.  Allergies  Allergen Reactions   Penicillins     Review of Systems: Negative except as noted in the HPI.   Objective:  Michele Lawrence is a pleasant 78 y.o. female in NAD. AAO x 3.  Vascular Examination: Vascular status intact b/l with palpable pedal pulses. CFT immediate b/l. Pedal hair present. No edema. No pain with calf compression b/l. Skin temperature gradient WNL b/l. No varicosities noted. No cyanosis or clubbing noted.  Neurological Examination: Sensation grossly intact b/l with 10 gram monofilament. Vibratory sensation intact b/l.  Dermatological Examination: Pedal skin with normal turgor, texture and tone b/l. No open wounds nor interdigital macerations noted. Toenails 1-5 b/l thick, discolored, elongated with subungual debris and pain on dorsal palpation. No hyperkeratotic lesions noted b/l.   Musculoskeletal Examination: Muscle strength 5/5 to b/l LE.  No pain, crepitus noted b/l. No gross pedal deformities. Patient ambulates independently without assistive aids.   Radiographs: None  Last A1c:       No data to display           Assessment:   1. Pain due to onychomycosis of toenails of both feet    Plan:  -Patient with h/o dementia/Alzheimer's/cognitive deficit. Patient's family member present. All questions/concerns addressed on today's visit. -Patient to continue soft, supportive shoe gear daily. -Toenails 1-5 b/l were debrided in length and girth with sterile nail nippers and dremel without iatrogenic bleeding.   -Patient/POA to call should there be question/concern in the interim.  Return in about 3 months (around 06/21/2024).  Michele Lawrence, DPM      [facility] LOCATION: [address] [place] [address]                   Office [phone]   [facility] LOCATION: [address] [place], [place] [address] Office [phone]

## 2024-03-26 ENCOUNTER — Telehealth: Payer: Self-pay | Admitting: Family Medicine

## 2024-03-26 NOTE — Telephone Encounter (Signed)
 Copied from CRM [id number]. Topic: General - Other >> Mar 26, 2024 11:34 AM Antwanette L wrote: Reason for CRM: Patient husband (ian Ferg) is calling to get a letter saying that the patient has Alzheimer's. Mr. Bolding needs the letter to show SSA. Mr. Foutz can be contacted by phone at [phone] whenever the letter is ready

## 2024-06-24 ENCOUNTER — Encounter: Payer: Self-pay | Admitting: Podiatry

## 2024-06-24 ENCOUNTER — Ambulatory Visit: Admitting: Podiatry

## 2024-06-24 DIAGNOSIS — M79674 Pain in right toe(s): Secondary | ICD-10-CM

## 2024-06-24 DIAGNOSIS — B351 Tinea unguium: Secondary | ICD-10-CM | POA: Diagnosis not present

## 2024-06-24 DIAGNOSIS — M79675 Pain in left toe(s): Secondary | ICD-10-CM

## 2024-06-29 NOTE — Progress Notes (Signed)
  Subjective:  Patient ID: Michele Lawrence, female    DOB: 1945-11-19,  MRN: [id number]  Michele Lawrence presents to clinic today for painful thick toenails that are difficult to trim. Pain interferes with ambulation. Aggravating factors include wearing enclosed shoe gear. Pain is relieved with periodic professional debridement. Patient has h/o dementia and is accompanied by her husband on today's visit. Chief Complaint  Patient presents with   RFC     RFC Non diabetic toenail trim. LOV with PCP. 01/2024.   New problem(s): None.   PCP is Donzella Lauraine SAILOR, DO.  Allergies  Allergen Reactions   Penicillins     Review of Systems: Negative except as noted in the HPI.  Objective: No changes noted in today's physical examination. There were no vitals filed for this visit. Michele Lawrence is a pleasant 78 y.o. female thin build in NAD. AAO x 2.  Vascular Examination: Vascular status intact b/l with palpable pedal pulses. CFT immediate b/l. Pedal hair present. No edema. No pain with calf compression b/l. Skin temperature gradient WNL b/l. No varicosities noted. No cyanosis or clubbing noted.  Neurological Examination: Sensation grossly intact b/l with 10 gram monofilament. Vibratory sensation intact b/l.  Dermatological Examination: Pedal skin with normal turgor, texture and tone b/l. No open wounds nor interdigital macerations noted. Toenails 1-5 b/l thick, discolored, elongated with subungual debris and pain on dorsal palpation. No hyperkeratotic lesions noted b/l.   Musculoskeletal Examination: Muscle strength 5/5 to b/l LE.  No pain, crepitus noted b/l. No gross pedal deformities. Patient ambulates independently without assistive aids.   Radiographs: None  Assessment/Plan: 1. Pain due to onychomycosis of toenails of both feet   -Patient with h/o dementia/Alzheimer's/cognitive deficit. Patient's family member present. All questions/concerns addressed on today's visit. -Continue  supportive shoe gear daily. -Mycotic toenails 1-5 bilaterally were debrided in length and girth with sterile nail nippers and dremel without incident. -Patient/POA to call should there be question/concern in the interim.   Return in about 3 months (around 09/24/2024).  Michele Lawrence, DPM      [facility] LOCATION: [address], [place] [address]                   Office [phone]   [facility] LOCATION: [address] [place], [place] [address] Office [phone]

## 2024-08-12 ENCOUNTER — Other Ambulatory Visit: Payer: Self-pay | Admitting: Family Medicine

## 2024-08-12 MED ORDER — QUETIAPINE FUMARATE 25 MG PO TABS: 25.0000 mg | ORAL_TABLET | Freq: Every day | ORAL | 11 refills | Status: DC

## 2024-08-12 MED ORDER — CITALOPRAM HYDROBROMIDE 10 MG PO TABS: 10.0000 mg | ORAL_TABLET | Freq: Every day | ORAL | 11 refills | Status: DC

## 2024-08-12 NOTE — Telephone Encounter (Signed)
 Copied from CRM [id number]. Topic: Clinical - Medication Refill >> Aug 12, 2024 12:33 PM Yolanda T wrote: Medication: citalopram  (CELEXA ) 10 MG tablet and QUEtiapine  (SEROQUEL ) 25 MG tablet  Has the patient contacted their pharmacy? Yes  This is the patient's preferred pharmacy:  [facility] [id number] [place], [place] - [address] [place] [address] Phone: [phone] Fax: [phone]  Is this the correct pharmacy for this prescription? Yes  Has the prescription been filled recently? Yes  Is the patient out of the medication? Yes  Has the patient been seen for an appointment in the last year OR does the patient have an upcoming appointment? Yes  Can we respond through MyChart? Yes  Agent: Please be advised that Rx refills may take up to 3 business days. We ask that you follow-up with your pharmacy.

## 2024-08-13 ENCOUNTER — Other Ambulatory Visit: Payer: Self-pay

## 2024-08-13 ENCOUNTER — Emergency Department

## 2024-08-13 ENCOUNTER — Emergency Department
Admission: EM | Admit: 2024-08-13 | Discharge: 2024-08-13 | Disposition: A | Discharge status: Home / Self Care | Attending: Emergency Medicine | Admitting: Emergency Medicine

## 2024-08-13 ENCOUNTER — Encounter: Payer: Self-pay | Admitting: Emergency Medicine

## 2024-08-13 DIAGNOSIS — I129 Hypertensive chronic kidney disease with stage 1 through stage 4 chronic kidney disease, or unspecified chronic kidney disease: Secondary | ICD-10-CM | POA: Insufficient documentation

## 2024-08-13 DIAGNOSIS — S4991XA Unspecified injury of right shoulder and upper arm, initial encounter: Secondary | ICD-10-CM | POA: Diagnosis present

## 2024-08-13 DIAGNOSIS — S42201A Unspecified fracture of upper end of right humerus, initial encounter for closed fracture: Secondary | ICD-10-CM | POA: Diagnosis not present

## 2024-08-13 DIAGNOSIS — N182 Chronic kidney disease, stage 2 (mild): Secondary | ICD-10-CM | POA: Insufficient documentation

## 2024-08-13 DIAGNOSIS — G309 Alzheimer's disease, unspecified: Secondary | ICD-10-CM | POA: Diagnosis not present

## 2024-08-13 DIAGNOSIS — I6782 Cerebral ischemia: Secondary | ICD-10-CM | POA: Diagnosis not present

## 2024-08-13 DIAGNOSIS — S0990XA Unspecified injury of head, initial encounter: Secondary | ICD-10-CM | POA: Diagnosis not present

## 2024-08-13 DIAGNOSIS — F0280 Dementia in other diseases classified elsewhere without behavioral disturbance: Secondary | ICD-10-CM | POA: Diagnosis not present

## 2024-08-13 DIAGNOSIS — W06XXXA Fall from bed, initial encounter: Secondary | ICD-10-CM | POA: Diagnosis not present

## 2024-08-13 DIAGNOSIS — S42291A Other displaced fracture of upper end of right humerus, initial encounter for closed fracture: Secondary | ICD-10-CM | POA: Diagnosis not present

## 2024-08-13 DIAGNOSIS — E039 Hypothyroidism, unspecified: Secondary | ICD-10-CM | POA: Diagnosis not present

## 2024-08-13 DIAGNOSIS — M70811 Other soft tissue disorders related to use, overuse and pressure, right shoulder: Secondary | ICD-10-CM | POA: Diagnosis not present

## 2024-08-13 DIAGNOSIS — M5030 Other cervical disc degeneration, unspecified cervical region: Secondary | ICD-10-CM | POA: Diagnosis not present

## 2024-08-13 DIAGNOSIS — M7989 Other specified soft tissue disorders: Secondary | ICD-10-CM | POA: Diagnosis not present

## 2024-08-13 DIAGNOSIS — S199XXA Unspecified injury of neck, initial encounter: Secondary | ICD-10-CM | POA: Diagnosis not present

## 2024-08-13 NOTE — ED Notes (Signed)
 See triage note  Presents with family s/p fall  Fell from bed yesterday  Alveria with some bruising noted to right arm

## 2024-08-13 NOTE — ED Provider Notes (Signed)
-----------------------------------------   3:17 PM on 08/13/2024 -----------------------------------------  Blood pressure 106/60, pulse 70, temperature 97.7 F (36.5 C), temperature source Oral, resp. rate 19, height 5' 2 (1.575 m), weight 48.1 kg, SpO2 96%.  Assuming care from Poggi, PA-C.  In short, Michele Lawrence is a 78 y.o. female with a chief complaint of Fall .  Refer to the original H&P for additional details.  The current plan of care is to await CT head and cervical spine.  ----------------------------------------- 4:30 PM on 08/13/2024 -----------------------------------------  IMPRESSION:  1. No acute intracranial abnormality. No skull fracture.  2. Generalized atrophy and chronic small vessel ischemia. 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   IMPRESSION:  1. No acute fracture or traumatic subluxation of the cervical spine.  2. Multilevel chronic change.   Results updated to patient and husband at bedside. Patient discharge home in care of her husband.   Clinical Course as of 08/13/24 1517  Tue Aug 13, 2024  1506 Message sent to orthopedics [JP]    Clinical Course User Index [JP] Poggi, Jenna E, PA-C      Zeplin Aleshire A, PA-C 08/13/24 1632    Arlander Charleston, MD 08/14/24 1031

## 2024-08-13 NOTE — Discharge Instructions (Signed)
 Please wear the sling at all times to immobilize your shoulder.  Please follow-up with orthopedics, call today or tomorrow to make this appointment.  You may take Tylenol /ibuprofen per package instructions to help with symptoms.  Please return for any new, worsening, or change in symptoms or other concerns.  It was a pleasure caring for you today.

## 2024-08-13 NOTE — ED Triage Notes (Signed)
 Per pt husband, pt fell out of bed yesterday and has been having swelling and bruising to the right upper arm. Pt has a hx of dementia with alzheimers and is not able to express feelings.

## 2024-08-13 NOTE — ED Provider Notes (Signed)
 [facility] Provider Note    Event Date/Time   First MD Initiated Contact with Patient 08/13/24 1345     (approximate)   History   Fall   HPI  Michele Lawrence is a 78 y.o. female who presents today for evaluation of shoulder injury.  Patient reportedly fell out of her bed yesterday and has been having bruising and swelling to her upper arm.  Patient's husband who is at the bedside reports that he did not witness the fall, but heard the fall and immediately came to her assistance.  He does not think that she struck her head.  He lifted her back into bed and she slept, but this morning he noticed that she had bruising and swelling to her arm prompting him to bring her to the emergency department.  Patient is unable to participate in exam or history given her Alzheimer's with behavioral disturbance, but at mental baseline according to husband.  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7 F (36.5 C)     Temp Source 08/13/24 1306 Oral     SpO2 08/13/24 1306 96 %     Weight 08/13/24 1307 106 lb (48.1 kg)     Height 08/13/24 1307 5' 2 (1.575 m)     Head Circumference --      Peak Flow --      Pain Score --      Pain Loc --      Pain Education --      Exclude from Growth Chart --     Most recent vital signs: Vitals:   08/13/24 1306  BP: 106/60  Pulse: 70  Resp: 19  Temp: 97.7 F (36.5 C)  SpO2: 96%    Physical Exam Vitals and nursing note reviewed.  Constitutional:      General: Awake and alert. No acute distress.    Appearance: Normal appearance. The patient is normal weight.  HENT:     Head: Normocephalic and atraumatic.     Mouth: Mucous membranes are moist.  Eyes:     General: PERRL. Normal EOMs        Right eye: No discharge.        Left eye: No discharge.     Conjunctiva/sclera: Conjunctivae normal.  Cardiovascular:  Rate and Rhythm: Normal rate and regular rhythm.     Pulses: Normal pulses.  Pulmonary:     Effort: Pulmonary effort is normal. No respiratory distress.     Breath sounds: Normal breath sounds.  Abdominal:     Abdomen is soft. There is no abdominal tenderness. No rebound or guarding. No distention. Musculoskeletal:        General: No swelling. Normal range of motion.     Cervical back: Normal range of motion and neck supple.  Tenderness palpation to cervical spine. Right upper extremity: Cradling her arm against her torso.  There is ecchymosis noted to the medial aspect of her arm.  Normal radial pulse. TTP to proximal humerus, does not want to move her arm. Normal radial pulse Skin:    General: Skin is warm and dry.     Capillary Refill: Capillary refill takes less than 2 seconds.     Findings: No rash.  Neurological:     Mental Status: The patient is awake and alert.  At mental baseline.     ED Results / Procedures / Treatments    Labs (all labs ordered are listed, but only abnormal results are displayed) Labs Reviewed - No data to display   EKG     RADIOLOGY I independently reviewed and interpreted imaging and agree with radiologists findings.     PROCEDURES:  Critical Care performed:   Procedures   MEDICATIONS ORDERED IN ED: Medications - No data to display   IMPRESSION / MDM / ASSESSMENT AND PLAN / ED COURSE  I reviewed the triage vital signs and the nursing notes.   Differential diagnosis includes, but is not limited to, fracture, dislocation, contusion.  Patient is awake and alert, hemodynamically stable and afebrile.  She is not participatory in exam, but is at her baseline per husband who is at the bedside.  Patient has obvious ecchymosis to her right upper extremity, and x-ray of her shoulder and elbow obtained.  CT head and neck also obtained given unwitnessed fall, tenderness to cervical spine, and age over 63 per [place] criteria.  Patient has a proximal humerus fracture, message sent to orthopedics for follow-up.  She was placed in a sling.  She is neurovascularly intact with normal radial pulse, normal and full range of motion of her wrist and fingers.  Patient was passed off to M. Margrette, PA-C, pending CT head and neck.   Patient's presentation is most consistent with acute complicated illness / injury requiring diagnostic workup.  Clinical Course as of 08/13/24 1518  Tue Aug 13, 2024  1506 Message sent to orthopedics [JP]    Clinical Course User Index [JP] Kristie Bracewell E, PA-C     FINAL CLINICAL IMPRESSION(S) / ED DIAGNOSES   Final diagnoses:  Closed fracture of proximal end of right humerus, unspecified fracture morphology, initial encounter  Injury of head, initial encounter     Rx / DC Orders   ED Discharge Orders     None        Note:  This document was prepared using Dragon voice recognition software and may include unintentional dictation  errors.   Flois Mctague E, PA-C 08/13/24 1518    Arlander Charleston, MD 08/13/24 1520

## 2024-08-15 ENCOUNTER — Telehealth: Payer: Self-pay

## 2024-08-15 DIAGNOSIS — R627 Adult failure to thrive: Secondary | ICD-10-CM

## 2024-08-15 DIAGNOSIS — Z9181 History of falling: Secondary | ICD-10-CM

## 2024-08-15 DIAGNOSIS — Z7409 Other reduced mobility: Secondary | ICD-10-CM

## 2024-08-15 NOTE — Telephone Encounter (Signed)
 Copied from CRM [id number]. Topic: Clinical - Order For Equipment >> Aug 15, 2024  3:20 PM Suzette B wrote: Reason for CRM: Patient's spouse Quaneisha Hanisch has called from [phone] stating that he is in need of a hospital bed for the patient she has repeatedly falling out of bed to the point. She was taken to [facility] Emergency Department at [facility] [name], Tuesday and currently has a fractured humerus. Mr. Mcgloin states to eliminate further falling out of bed he needs the hospital bed for the patient. Please call above number and advise.

## 2024-08-19 ENCOUNTER — Telehealth: Payer: Self-pay

## 2024-08-19 DIAGNOSIS — R627 Adult failure to thrive: Secondary | ICD-10-CM | POA: Insufficient documentation

## 2024-08-19 NOTE — Telephone Encounter (Signed)
 Copied from CRM [id number]. Topic: Clinical - Order For Equipment >> Aug 15, 2024  3:20 PM Suzette B wrote: Reason for CRM: Patient's spouse Relena Ivancic has called from [phone] stating that he is in need of a hospital bed for the patient she has repeatedly falling out of bed to the point. She was taken to [name] at [name] - [name] [place], Tuesday and currently has a fractured humerus. [name] states to eliminate further falling out of bed he needs the hospital bed for the patient. Please call above number and advise. >> Aug 19, 2024  3:01 PM Amber H wrote: Aliviah Spain called and stated no one has returned his call about ordering nursing assistance and a hospital bed. I advised to him that an order was placed today for a hospital bed, shower chair, and a bedside commode. He stated he only needs a hospital bed, arm sling, and nursing assistance due to patient not being able to stand. He stated the commode or shower chair is not a necessity right now. Requested to speak directly to clinic. I reached out to [facility] at [phone] and spoke to [place]. Was advised to let [name] know that he would have to speak to a nurse however, they were not available at the moment. Advised to send message. [name] stated he needs a call back as soon as possible.    [name]- [phone]  >> Aug 19, 2024  9:57 AM [name] wrote: Patient's husband called again requesting home health services. Patient is requesting to speak directly with the clinic.

## 2024-08-19 NOTE — Telephone Encounter (Signed)
 Copied from CRM [id number]. Topic: Clinical - Order For Equipment >> Aug 15, 2024  3:20 PM Suzette B wrote: Reason for CRM: Patient's spouse Michele Lawrence has called from [phone] stating that he is in need of a hospital bed for the patient she has repeatedly falling out of bed to the point. She was taken to [name] at [name] - [name] [place], Tuesday and currently has a fractured humerus. [name] states to eliminate further falling out of bed he needs the hospital bed for the patient. Please call above number and advise. >> Aug 19, 2024  3:01 PM Amber H wrote: Michele Lawrence called and stated no one has returned his call about ordering nursing assistance and a hospital bed. I advised to him that an order was placed today for a hospital bed, shower chair, and a bedside commode. He stated he only needs a hospital bed, arm sling, and nursing assistance due to patient not being able to stand. He stated the commode or shower chair is not a necessity right now. Requested to speak directly to clinic. I reached out to [facility] at [phone] and spoke to [place]. Was advised to let [name] know that he would have to speak to a nurse however, they were not available at the moment. Advised to send message. [name] stated he needs a call back as soon as possible.    [name]- [phone]  >> Aug 19, 2024  9:57 AM [name] wrote: Patient's husband called again requesting home health services. Patient is requesting to speak directly with the clinic.

## 2024-08-19 NOTE — Telephone Encounter (Signed)
 Copied from CRM [id number]. Topic: Clinical - Order For Equipment >> Aug 15, 2024  3:20 PM Suzette B wrote: Reason for CRM: Patient's spouse Onica Davidovich has called from [phone] stating that he is in need of a hospital bed for the patient she has repeatedly falling out of bed to the point. She was taken to [facility] Emergency Department at [facility] - [name] [place], Tuesday and currently has a fractured humerus. Mr. Garn states to eliminate further falling out of bed he needs the hospital bed for the patient. Please call above number and advise. >> Aug 19, 2024  9:57 AM [name] wrote: Patient's husband called again requesting home health services. Patient is requesting to speak directly with the clinic.

## 2024-08-19 NOTE — Telephone Encounter (Signed)
 Copied from CRM [id number]. Topic: General - Call Back - No Documentation >> Aug 19, 2024  1:27 PM Olam RAMAN wrote: Reason for CRM: [facility] planning/management officer. rcv a referral for [facility] care. pt spouse went into office and asked for more information for services. cb [phone] ext [phone]

## 2024-08-20 ENCOUNTER — Ambulatory Visit: Payer: Self-pay

## 2024-08-20 ENCOUNTER — Telehealth: Payer: Self-pay

## 2024-08-20 NOTE — Telephone Encounter (Signed)
 Copied from CRM [id number]. Topic: Referral - Request for Referral >> Aug 20, 2024 12:12 PM Amy B wrote: Did the patient discuss referral with their provider in the last year? Yes (If No - schedule appointment) (If Yes - send message)  Appointment offered? No  Type of order/referral and detailed reason for visit: Home Health  Preference of office, provider, location: [place]  If referral order, have you been seen by this specialty before? No (If Yes, this issue or another issue? When? Where?  Can we respond through MyChart? No Prefers phone call

## 2024-08-20 NOTE — Telephone Encounter (Signed)
 FYI Only or Action Required?: Action required by provider: clinical question for provider.  Patient was last seen in primary care on 01/08/2024 by Donzella Lauraine SAILOR, DO.  Called Nurse Triage reporting Fall.  Symptoms began several days ago.  Interventions attempted: Rest, hydration, or home remedies.  Symptoms are: gradually worsening.  Triage Disposition: Information or Advice Only Call  Patient/caregiver understands and will follow disposition?: No, wishes to speak with PCP Reason for Disposition  Health information question, no triage required and triager able to answer question  Answer Assessment - Initial Assessment Questions Speaking with patient's husband, [name]. States he is really struggling and needs help. Patient is totally dependent for care, having a hard time standing. [name] is caring for her all by himself. He's called multiple times regarding status updates for a hospital bed and in home assistance and states no one has called him back. Relayed notes that an order was placed yesterday for a hospital bed. Patient has orthopedic appointment on Friday, and is concerned he won't be able to get her in the car and he really needs help. Called [facility] for further assistance, was put on hold for 20 minutes and had to disconnect. Patient is requesting someone from the actual office give him a call with some answers. [phone]  1. REASON FOR CALL: What is the main reason for your call? or How can I best help you?     Calling patient's husband back in regards to his requests for a hospital bed and in home nursing assistance  2. SYMPTOMS : Do you have any symptoms?      Fractured humerus, bruising is going down, struggling to stand  Protocols used: Information Only Call - No Triage-A-AH  Copied from CRM [id number]. Topic: Clinical - Medical Advice >> Aug 20, 2024 12:20 PM Amy B wrote: Reason for CRM: Patient's husband has called several times and states no one has called him back.  His  wife fell out of bed and he needs to discuss her care as soon as possible.  Please requests a call back [phone].

## 2024-08-21 NOTE — Telephone Encounter (Signed)
 Called patient's husband yesterday to let him know that we was working on placing the order for the hospital bed, he expressed that he was wanting to a nurse to come out to the house to help him with the patient as well as a new sling before Friday before her orthopedic appointment.  Also followed up with husband today to inform him that the hospital bed had been ordered and that I would talk with provider regarding his request for the patient a new arm sling and a nurse to come out to the home.  Please advise.

## 2024-08-21 NOTE — Telephone Encounter (Signed)
 Please see other telephone encounter.

## 2024-08-22 NOTE — Telephone Encounter (Signed)
 Left message for Mr. Szeto to call me back to schedule a virtual appt with Kellie so a HH order can be placed.

## 2024-08-22 NOTE — Telephone Encounter (Signed)
 [email or web address] both providers.  Patient has an appointment with Janna tomorrow at 4:00 pm.

## 2024-08-23 ENCOUNTER — Ambulatory Visit: Admitting: Family Medicine

## 2024-08-23 ENCOUNTER — Ambulatory Visit: Admitting: Physician Assistant

## 2024-08-23 ENCOUNTER — Encounter: Payer: Self-pay | Admitting: Family Medicine

## 2024-08-23 VITALS — BP 110/67 | HR 67

## 2024-08-23 DIAGNOSIS — R634 Abnormal weight loss: Secondary | ICD-10-CM | POA: Diagnosis not present

## 2024-08-23 DIAGNOSIS — S42201D Unspecified fracture of upper end of right humerus, subsequent encounter for fracture with routine healing: Secondary | ICD-10-CM | POA: Diagnosis not present

## 2024-08-23 DIAGNOSIS — R5381 Other malaise: Secondary | ICD-10-CM

## 2024-08-23 DIAGNOSIS — M8000XD Age-related osteoporosis with current pathological fracture, unspecified site, subsequent encounter for fracture with routine healing: Secondary | ICD-10-CM

## 2024-08-23 DIAGNOSIS — E7800 Pure hypercholesterolemia, unspecified: Secondary | ICD-10-CM

## 2024-08-23 DIAGNOSIS — I10 Essential (primary) hypertension: Secondary | ICD-10-CM

## 2024-08-23 DIAGNOSIS — E038 Other specified hypothyroidism: Secondary | ICD-10-CM

## 2024-08-23 DIAGNOSIS — R627 Adult failure to thrive: Secondary | ICD-10-CM | POA: Diagnosis not present

## 2024-08-23 DIAGNOSIS — Z7409 Other reduced mobility: Secondary | ICD-10-CM

## 2024-08-23 DIAGNOSIS — R7309 Other abnormal glucose: Secondary | ICD-10-CM

## 2024-08-23 DIAGNOSIS — G301 Alzheimer's disease with late onset: Secondary | ICD-10-CM

## 2024-08-23 DIAGNOSIS — M25511 Pain in right shoulder: Secondary | ICD-10-CM | POA: Diagnosis not present

## 2024-08-23 DIAGNOSIS — S42201A Unspecified fracture of upper end of right humerus, initial encounter for closed fracture: Secondary | ICD-10-CM | POA: Diagnosis not present

## 2024-08-23 DIAGNOSIS — W06XXXA Fall from bed, initial encounter: Secondary | ICD-10-CM | POA: Diagnosis not present

## 2024-08-23 DIAGNOSIS — F02818 Dementia in other diseases classified elsewhere, unspecified severity, with other behavioral disturbance: Secondary | ICD-10-CM

## 2024-08-23 NOTE — Progress Notes (Signed)
 Established patient visit   [name]   DOB: [birth year]   78 y.o. Female  MRN: [id number] Visit Date: 08/23/2024  Today's healthcare provider: LAURAINE LOISE BUOY, DO   Chief Complaint  Patient presents with   Hospitalization Follow-up    Patient is here for a hospital follow up but also face to face for home health order to be placed.   Subjective    HPI [name] is a 78 year old female with dementia who presents with decreased mobility and functional decline following an arm injury 08/13/2024.  Over the past two weeks, she has experienced a significant decline in mobility and functional status following an arm injury. Previously, she was able to walk with assistance and move from the couch to the dining room table or bed. However, she has not moved from these locations since the injury and has not been able to shower or use the bathroom independently. She is now completely dependent on her husband to access her caregiver for transfers and personal care.  Her husband reports no pain in her arm, as she is able to sleep on it without discomfort. She has been using a lightweight sling, which was deemed acceptable by the orthopedist. Her caregiver reports that the orthopedist reviewed x-rays earlier today and stated that her arm is healing well and that surgery is not required. A follow-up appointment with orthopedics is scheduled in two weeks.  She has been sleeping more than usual, approximately 12 to 14 hours a day. Her caregiver initially administered Tylenol  twice daily for three days post-injury but discontinued it due to concerns about increased sleepiness. She has not reported any pain, and her caregiver is unsure if the increased sleep is due to the medication or other factors.  Her caregiver is seeking additional support due to the increased difficulty in managing her care needs at home.  He has explored options for home health and palliative care services  and is in the process of coordinating these services this clinic and insurance.  She received the flu, COVID, and pneumonia vaccines approximately three weeks ago at a local pharmacy. Her caregiver noted that she did not receive any physical documentation of the vaccinations, as records are now stored digitally.      Medications: Outpatient Medications Prior to Visit  Medication Sig   acetaminophen  (TYLENOL ) 500 MG tablet Take 500 mg by mouth every 6 (six) hours as needed for moderate pain or headache. Reported on 03/18/2016   amLODipine  (NORVASC ) 2.5 MG tablet Take 1 tablet (2.5 mg total) by mouth daily.   citalopram  (CELEXA ) 10 MG tablet Take 1 tablet (10 mg total) by mouth daily.   donepezil  (ARICEPT ) 10 MG tablet Take 1 tablet (10 mg total) by mouth daily. Take 1 tablet daily with breakfast   memantine  (NAMENDA ) 10 MG tablet Take 1 tablet (10 mg total) by mouth 2 (two) times daily.   QUEtiapine  (SEROQUEL ) 25 MG tablet Take 1 tablet (25 mg total) by mouth at bedtime.   No facility-administered medications prior to visit.        Objective    BP 110/67 (BP Location: Left Arm, Patient Position: Sitting, Cuff Size: Normal)   Pulse 67   SpO2 99%     Physical Exam Constitutional:      Appearance: Normal appearance.  HENT:     Head: Normocephalic and atraumatic.  Eyes:     General: No scleral icterus.    Extraocular Movements: Extraocular  movements intact.     Conjunctiva/sclera: Conjunctivae normal.  Cardiovascular:     Rate and Rhythm: Normal rate and regular rhythm.     Pulses: Normal pulses.     Heart sounds: Normal heart sounds.  Pulmonary:     Effort: Pulmonary effort is normal. No respiratory distress.     Breath sounds: Normal breath sounds.  Musculoskeletal:     Right lower leg: No edema.     Left lower leg: No edema.  Skin:    General: Skin is warm and dry.  Neurological:     Mental Status: She is alert and oriented to person, place, and time. Mental status  is at baseline.  Psychiatric:        Mood and Affect: Mood normal.        Behavior: Behavior normal.      No results found for any visits on 08/23/24.  Assessment & Plan    Failure to thrive in adult -     Ambulatory referral to Home Health -     Amb Referral to Palliative Care  Weight loss, unintentional -     Ambulatory referral to Home Health -     Amb Referral to Palliative Care  Physical deconditioning -     Ambulatory referral to Home Health -     Amb Referral to Palliative Care  Impaired mobility -     Ambulatory referral to Home Health -     Amb Referral to Palliative Care  Alzheimer's dementia, late onset, with behavioral disturbance (HCC) -     Ambulatory referral to Home Health -     Amb Referral to Palliative Care  Closed fracture of proximal end of right humerus with routine healing, unspecified fracture morphology, subsequent encounter -     Ambulatory referral to Home Health -     Amb Referral to Palliative Care  Primary hypertension -     Comprehensive metabolic panel with GFR  Pure hypercholesterolemia -     Lipid panel  Subclinical hypothyroidism -     TSH Rfx on Abnormal to Free T4  Elevated hemoglobin A1c -     Hemoglobin A1c  Age-related osteoporosis with current pathological fracture with routine healing, subsequent encounter     Adult failure to thrive; weight loss, unintentional; physical deconditioning; impaired mobility Unable to move independently, requires assistance for basic activities, recent falls, and inability to bear weight on feet. Excessive sleepiness observed with Tylenol  use. Discussed need for hospital bed with rails for safety as well as home health.  Patient's husband would also like a palliative care consult for more information regarding the service. - Order basic blood work including thyroid  level and hemoglobin A1c. - Send home health order for nursing assistance. - Send palliative care order for patient's husband to  obtain more information - Order hospital bed with rails for safety. - Patient's husband declined prescriptions for bedside commode and shower chair, as transfer into the use is not feasible for him.  Alzheimer's disease, late onset, with behavioral disturbance Significant cognitive decline, requires assistance with daily activities, increased sleeping possibly related to medication or disease progression. Discussed potential for palliative care support. - Continue memantine , donepezil , quetiapine , and citalopram . - Send home health order. - Send palliative care order.  Closed fracture of proximal end of right humerus, with routine healing Routine healing of closed fracture of the proximal end of the right humerus. Recent orthopedic evaluation with x-rays showed no need for surgical intervention.  Patient reports  no pain in the arm (and does not display signs or symptoms of pain) and is able to sleep on it. - Follow up with orthopedics in two weeks as scheduled. - Continue using the lightweight sling as it is comfortable for her.  Primary hypertension Chronic, stable.  Continue amlodipine  2.5 mg daily.  Order CMP today.  Age-related osteoporosis with current pathological fracture with routine healing, subsequent encounter In consideration of her other comorbidities, patient's husband previously decided against pursuing treatment for the patient's osteoporosis.    Return in about 6 weeks (around 10/04/2024) for mAWV with AWV nurse, and Chronic f/u in 3 months.      I discussed the assessment and treatment plan with the patient  The patient was provided an opportunity to ask questions and all were answered. The patient agreed with the plan and demonstrated an understanding of the instructions.   The patient was advised to call back or seek an in-person evaluation if the symptoms worsen or if the condition fails to improve as anticipated.    LAURAINE LOISE BUOY, DO  [facility] [phone] (phone) [phone] (fax)  [facility]

## 2024-08-24 LAB — LIPID PANEL
Chol/HDL Ratio: 4.1 ratio (ref 0.0–4.4)
Cholesterol, Total: 182 mg/dL (ref 100–199)
HDL: 44 mg/dL (ref >39)
LDL Chol Calc (NIH): 119 mg/dL — ABNORMAL HIGH (ref 0–99)
Triglycerides: 107 mg/dL (ref 0–149)
VLDL Cholesterol Cal: 19 mg/dL (ref 5–40)

## 2024-08-24 LAB — COMPREHENSIVE METABOLIC PANEL WITH GFR
ALT: 10 IU/L (ref 0–32)
AST: 26 IU/L (ref 0–40)
Albumin: 3.8 g/dL (ref 3.8–4.8)
Alkaline Phosphatase: 179 IU/L — ABNORMAL HIGH (ref 49–135)
BUN/Creatinine Ratio: 14 (ref 12–28)
BUN: 14 mg/dL (ref 8–27)
Bilirubin Total: 0.4 mg/dL (ref 0.0–1.2)
CO2: 25 mmol/L (ref 20–29)
Calcium: 9.3 mg/dL (ref 8.7–10.3)
Chloride: 103 mmol/L (ref 96–106)
Creatinine, Ser: 1 mg/dL (ref 0.57–1.00)
Globulin, Total: 2.9 g/dL (ref 1.5–4.5)
Glucose: 113 mg/dL — ABNORMAL HIGH (ref 70–99)
Potassium: 4.2 mmol/L (ref 3.5–5.2)
Sodium: 140 mmol/L (ref 134–144)
Total Protein: 6.7 g/dL (ref 6.0–8.5)
eGFR: 57 mL/min/1.73 — ABNORMAL LOW (ref >59)

## 2024-08-24 LAB — TSH RFX ON ABNORMAL TO FREE T4: TSH: 3.55 u[IU]/mL (ref 0.450–4.500)

## 2024-08-24 LAB — HEMOGLOBIN A1C
Est. average glucose Bld gHb Est-mCnc: 103 mg/dL
Hgb A1c MFr Bld: 5.2 % (ref 4.8–5.6)

## 2024-08-29 ENCOUNTER — Telehealth: Payer: Self-pay

## 2024-08-29 NOTE — Telephone Encounter (Signed)
 Copied from CRM [id number]. Topic: Clinical - Home Health Verbal Orders >> Aug 28, 2024  4:38 PM Sophia H wrote: Caller/Agency: [facility]  Callback Number: [phone] (vmail not secured) Service Requested: Start of care orders - Home Health aid, Occupational therapy, social worker & wound care for patient (skin tear on elbow Frequency: 2x a week for 3 weeks, 1x a week for 5 weeks Any new concerns about the patient? Not besides maybe needing hospice care in the future, waiting on family for answer.

## 2024-08-30 ENCOUNTER — Ambulatory Visit: Payer: Self-pay | Admitting: Family Medicine

## 2024-09-03 ENCOUNTER — Telehealth: Payer: Self-pay

## 2024-09-03 NOTE — Telephone Encounter (Signed)
 Copied from CRM [id number]. Topic: Clinical - Home Health Verbal Orders >> Sep 03, 2024 11:53 AM Avram MATSU wrote: Caller/Agency: [facility]line [facility] Number: [phone] Service Requested: Wound Care Frequency: no changes Any new concerns about the patient? Yes 2 small skin tears on arm

## 2024-09-03 NOTE — Telephone Encounter (Signed)
 Please review for Dr. Donzella

## 2024-09-04 ENCOUNTER — Telehealth: Payer: Self-pay | Admitting: Family Medicine

## 2024-09-04 NOTE — Telephone Encounter (Signed)
 Noted. Order for wound care authorized via previous message.

## 2024-09-04 NOTE — Telephone Encounter (Signed)
 Sending back DNR form to be signed.  He would like to pick this up on Friday 09/06/24.

## 2024-09-05 NOTE — Telephone Encounter (Signed)
 Advised Michele Lawrence via VM

## 2024-09-24 DIAGNOSIS — Z9181 History of falling: Secondary | ICD-10-CM

## 2024-09-24 DIAGNOSIS — G301 Alzheimer's disease with late onset: Secondary | ICD-10-CM | POA: Diagnosis not present

## 2024-09-24 DIAGNOSIS — E7800 Pure hypercholesterolemia, unspecified: Secondary | ICD-10-CM

## 2024-09-24 DIAGNOSIS — S41111D Laceration without foreign body of right upper arm, subsequent encounter: Secondary | ICD-10-CM | POA: Diagnosis not present

## 2024-09-24 DIAGNOSIS — F02818 Dementia in other diseases classified elsewhere, unspecified severity, with other behavioral disturbance: Secondary | ICD-10-CM

## 2024-09-24 DIAGNOSIS — M80021D Age-related osteoporosis with current pathological fracture, right humerus, subsequent encounter for fracture with routine healing: Secondary | ICD-10-CM | POA: Diagnosis not present

## 2024-09-24 DIAGNOSIS — R627 Adult failure to thrive: Secondary | ICD-10-CM | POA: Diagnosis not present

## 2024-09-24 DIAGNOSIS — R634 Abnormal weight loss: Secondary | ICD-10-CM

## 2024-09-24 DIAGNOSIS — E02 Subclinical iodine-deficiency hypothyroidism: Secondary | ICD-10-CM

## 2024-09-24 DIAGNOSIS — I10 Essential (primary) hypertension: Secondary | ICD-10-CM

## 2024-09-30 ENCOUNTER — Ambulatory Visit: Admitting: Podiatry

## 2024-09-30 ENCOUNTER — Telehealth: Payer: Self-pay | Admitting: Family Medicine

## 2024-09-30 ENCOUNTER — Encounter: Payer: Self-pay | Admitting: Podiatry

## 2024-09-30 DIAGNOSIS — M79675 Pain in left toe(s): Secondary | ICD-10-CM | POA: Diagnosis not present

## 2024-09-30 DIAGNOSIS — M79674 Pain in right toe(s): Secondary | ICD-10-CM

## 2024-09-30 DIAGNOSIS — I10 Essential (primary) hypertension: Secondary | ICD-10-CM

## 2024-09-30 DIAGNOSIS — B351 Tinea unguium: Secondary | ICD-10-CM | POA: Diagnosis not present

## 2024-09-30 MED ORDER — AMLODIPINE BESYLATE 2.5 MG PO TABS: 2.5000 mg | ORAL_TABLET | Freq: Every day | ORAL | 4 refills | Status: DC

## 2024-09-30 NOTE — Telephone Encounter (Signed)
[facility] faxed refill request for the following medications:  amLODipine (NORVASC) 2.5 MG tablet   Please advise.

## 2024-10-06 NOTE — Progress Notes (Signed)
  Subjective:  Patient ID: Michele Lawrence, female    DOB: 10-20-1946,  MRN: [id number]  Michele Lawrence presents to clinic today for painful mycotic toenails of both feet that are difficult to trim. Pain interferes with daily activities and wearing enclosed shoe gear comfortably.  Chief Complaint  Patient presents with   Toe Pain    She saw Dr. Donzella in Oct. Denies being diabetic   New problem(s): None.   PCP is Michele Lauraine SAILOR, Michele Lawrence.  Allergies  Allergen Reactions   Penicillins     Review of Systems: Negative except as noted in the HPI.  Objective: No changes noted in today's physical examination. There were no vitals filed for this visit. Michele Lawrence is a pleasant 78 y.o. female thin build in NAD. AAO x 3.   Vascular Examination: Vascular status intact b/l with palpable pedal pulses. CFT immediate b/l. Pedal hair present. No edema. No pain with calf compression b/l. Skin temperature gradient WNL b/l. No varicosities noted. No cyanosis or clubbing noted.  Neurological Examination: Sensation grossly intact b/l with 10 gram monofilament. Vibratory sensation intact b/l.  Dermatological Examination: Pedal skin with normal turgor, texture and tone b/l. No open wounds nor interdigital macerations noted. Toenails 1-5 b/l thick, discolored, elongated with subungual debris and pain on dorsal palpation. No hyperkeratotic lesions noted b/l.   Musculoskeletal Examination: Muscle strength 5/5 to b/l LE.  No pain, crepitus noted b/l. No gross pedal deformities. Patient ambulates independently without assistive aids.   Radiographs: None  Assessment/Plan: 1. Pain due to onychomycosis of toenails of both feet   -Patient with h/o dementia/Alzheimer's/cognitive deficit. Patient's family member present. All questions/concerns addressed on today's visit. -Examined patient. -Toenails 1-5 b/l were debrided in length and girth with sterile nail nippers and dremel without iatrogenic  bleeding.  -Patient/POA to call should there be question/concern in the interim.  Return in about 3 months (around 12/31/2024).  Michele Lawrence, DPM      [facility] LOCATION: [address], [place] [address]                   Office [phone]   [facility] LOCATION: [address] [place], [place] [address] Office [phone]

## 2024-10-22 ENCOUNTER — Telehealth: Payer: Self-pay

## 2024-10-22 NOTE — Telephone Encounter (Signed)
 Copied from CRM #[id number]. Topic: Clinical - Home Health Verbal Orders >> Oct 22, 2024  3:41 PM Leonette SQUIBB wrote: Caller/Agency  Beverley RN with [facility] health reporting a small skin tear on her rt fore arm and on her left hip area.  Her husband said it was from the wheelchair.  She wants to treat the abrasion and she thinks it will heal  [phone]

## 2024-10-24 ENCOUNTER — Telehealth: Payer: Self-pay | Admitting: Family Medicine

## 2024-10-24 DIAGNOSIS — G301 Alzheimer's disease with late onset: Secondary | ICD-10-CM

## 2024-10-24 MED ORDER — DONEPEZIL HCL 10 MG PO TABS: 10.0000 mg | ORAL_TABLET | Freq: Every day | ORAL | 4 refills | Status: DC

## 2024-10-24 NOTE — Telephone Encounter (Signed)
 [name] faxed refill request for the following medications:  donepezil  (ARICEPT ) 10 MG tablet     Please advise.

## 2024-10-25 NOTE — Telephone Encounter (Signed)
 Called Michele Lawrence and let her know the provider recommendation and she stated that she had already took care of it.  Mumbled something to herself which sounded like it was not appropriate.  The phone call was then disconnected.

## 2024-10-28 ENCOUNTER — Ambulatory Visit: Admitting: Family Medicine

## 2024-10-28 ENCOUNTER — Encounter: Payer: Self-pay | Admitting: Family Medicine

## 2024-10-28 VITALS — BP 122/73 | HR 67 | Ht 62.0 in | Wt 99.0 lb

## 2024-10-28 DIAGNOSIS — T07XXXA Unspecified multiple injuries, initial encounter: Secondary | ICD-10-CM | POA: Diagnosis not present

## 2024-10-28 DIAGNOSIS — T148XXA Other injury of unspecified body region, initial encounter: Secondary | ICD-10-CM

## 2024-10-28 DIAGNOSIS — Z9189 Other specified personal risk factors, not elsewhere classified: Secondary | ICD-10-CM

## 2024-10-28 DIAGNOSIS — Z743 Need for continuous supervision: Secondary | ICD-10-CM

## 2024-10-28 DIAGNOSIS — G301 Alzheimer's disease with late onset: Secondary | ICD-10-CM | POA: Diagnosis not present

## 2024-10-28 DIAGNOSIS — Z993 Dependence on wheelchair: Secondary | ICD-10-CM | POA: Diagnosis not present

## 2024-10-28 DIAGNOSIS — R627 Adult failure to thrive: Secondary | ICD-10-CM

## 2024-10-28 DIAGNOSIS — F02818 Dementia in other diseases classified elsewhere, unspecified severity, with other behavioral disturbance: Secondary | ICD-10-CM

## 2024-10-28 NOTE — Progress Notes (Signed)
 "     Established patient visit   [name]   DOB: 03-29-1946   78 y.o. Female  MRN: [id number] Visit Date: 10/28/2024  Today's healthcare provider: LAURAINE LOISE BUOY, DO   Chief Complaint  Patient presents with   Follow-up    Husband wants to discuss [facility] or Home Health regarding patient's care.  Would like to discuss coming off medications Donepezil  and Memantime.   Subjective    HPI [name] is an 78 year old female with Alzheimer's disease who presents for discussion of hospice care options. She is accompanied by her husband, [name], who was present in the exam room, and her daughter, Nathanel, who joined the conversation via speaker-phone.  She has been receiving home health services, which were discontinued last Friday. Occupational and physical therapists concluded that there was nothing more they could do. Nursing visits have also stopped, although there might be one more visit due to skin tears on her arm and hip. These tears were treated by letting them dry out on their own.  She is currently unable to walk or stand, and her husband assists her with daily activities, including moving her from bed to wheelchair and feeding her. She was initially able to feed herself after her arm fracture healed, but she is no longer able and does not make an effort to do so.  Her current medications include donepezil , quetiapine , citalopram , and amlodipine . Her husband has been administering these medications by crushing them and mixing them with applesauce. She does not exhibit agitation, and her family questions the necessity of quetiapine  and citalopram , which they know are for mood.  Her husband is managing her care at home, including addressing skin tears and using wipes for redness in her private areas. He expresses difficulty in providing full care, such as bed baths, and mentions the challenge of getting her in and out of the car. He is concerned about his ability  to continue providing care as he ages, as he will be turning 69 as well in the coming August.      Medications: Show/hide medication list[1]       Objective    BP 122/73 (BP Location: Left Arm, Patient Position: Sitting, Cuff Size: Normal)   Pulse 67   Ht 5' 2 (1.575 m)   Wt 99 lb (44.9 kg)   SpO2 98%   BMI 18.11 kg/m     Physical Exam Vitals and nursing note reviewed.  Constitutional:      General: She is not in acute distress.    Appearance: Normal appearance.  HENT:     Head: Normocephalic and atraumatic.  Eyes:     General: No scleral icterus.    Conjunctiva/sclera: Conjunctivae normal.  Cardiovascular:     Rate and Rhythm: Normal rate.  Pulmonary:     Effort: Pulmonary effort is normal.  Neurological:     Mental Status: She is alert and oriented to person, place, and time. Mental status is at baseline.  Psychiatric:        Mood and Affect: Mood normal.        Behavior: Behavior normal.      No results found for any visits on 10/28/24.  Assessment & Plan    Adult failure to thrive -     Ambulatory referral to Hospice  Alzheimer's dementia, late onset, with behavioral disturbance (HCC)  Requires continuous supervision for activities of daily living (ADL) -     Ambulatory referral to Hospice  Wheelchair dependence Assessment & Plan: Not able to transfer on her own or self-propel wheelchair. Able to remain seated in wheelchair.    Multiple skin tears  At high risk for pressure injury of skin     Adult failure to thrive  Significant functional decline, requiring assistance for all activities. Hospice care considered for additional support. Discussed hospice care's flexibility and comprehensive support, including nursing assistance and potential air mattress provision. - Proceed with scheduled home visit with [facility] for hospice assessment. - Coordinated with hospice for potential provision of an air mattress for pressure injury  prevention.  Alzheimer's dementia, late onset, with behavioral disturbance Current medications not beneficial. Family opted to discontinue donepezil , quetiapine , and citalopram . Amlodipine  also stopped due to minimal benefit. - Discontinued donepezil  and memantine . - Taper citalopram  and quetiapine  one at a time, with each taper of one pill every other day for four days, then stop.  Monitor for recurrence of agitation. - Discontinued amlodipine .  Requires continuous supervision for activities of daily living; dependence on wheelchair  Requires assistance for all activities. Hospice care considered for additional support. - Proceed with hospice care assessment to provide additional support.  Multiple skin tears; at high risk of pressure injury of skin Skin tears on arm and hip with pressure injury risk due to immobility. Current management involves keeping wounds open to dry out. - Continue current wound care management, keeping wounds open to dry out. - Consider use of Mepilex foam dressings for wound care if needed. - Plan to order air mattress overlay if needed; per patient's husband's preference, he will reach out to clinic for this if not provided by hospice.    Return if symptoms worsen or fail to improve.      I discussed the assessment and treatment plan with the patient  The patient was provided an opportunity to ask questions and all were answered. The patient agreed with the plan and demonstrated an understanding of the instructions.   The patient was advised to call back or seek an in-person evaluation if the symptoms worsen or if the condition fails to improve as anticipated.    LAURAINE LOISE BUOY, DO  [facility] [phone] (phone) [phone] (fax)  [facility]     [1]  Outpatient Medications Prior to Visit  Medication Sig   acetaminophen  (TYLENOL ) 500 MG tablet Take 500 mg by mouth every 6 (six) hours as needed for  moderate pain or headache. Reported on 03/18/2016   amLODipine  (NORVASC ) 2.5 MG tablet Take 1 tablet (2.5 mg total) by mouth daily.   citalopram  (CELEXA ) 10 MG tablet Take 1 tablet (10 mg total) by mouth daily.   QUEtiapine  (SEROQUEL ) 25 MG tablet Take 1 tablet (25 mg total) by mouth at bedtime.   [DISCONTINUED] donepezil  (ARICEPT ) 10 MG tablet Take 1 tablet (10 mg total) by mouth daily. Take 1 tablet daily with breakfast   [DISCONTINUED] memantine  (NAMENDA ) 10 MG tablet Take 1 tablet (10 mg total) by mouth 2 (two) times daily.   No facility-administered medications prior to visit.   "

## 2024-10-28 NOTE — Assessment & Plan Note (Signed)
 Not able to transfer on her own or self-propel wheelchair. Able to remain seated in wheelchair.

## 2024-10-29 ENCOUNTER — Telehealth: Payer: Self-pay

## 2024-10-29 NOTE — Telephone Encounter (Signed)
 Copied from CRM [id number]. Topic: Referral - Question >> Oct 28, 2024  4:14 PM [name] wrote: Reason for CRM: Kirsten from [facility] is calling to clarify that patients pcp is wanting hospice for the patient. She said there is not a lot of clinical info and would like for someone to call her to confirm.   PH: [phone].[name]

## 2024-10-29 NOTE — Telephone Encounter (Signed)
 Please advise

## 2024-10-30 NOTE — Telephone Encounter (Signed)
Closing as this is a duplicate

## 2024-11-01 NOTE — Telephone Encounter (Signed)
 Called and spoke with [facility] with [facility], she stated that she received a phone call from Reena ([employer]) on December 23 and she confirmed with her that the provider was referring patient to hospice.  I was calling to do the same however there is no documentation regarding any call that was made from [facility].  She stated that they would proceed and the patient has an appointment today with them @ 2:00 pm.

## 2024-11-05 ENCOUNTER — Ambulatory Visit

## 2024-11-15 ENCOUNTER — Other Ambulatory Visit: Payer: Self-pay | Admitting: Family Medicine

## 2024-11-22 ENCOUNTER — Ambulatory Visit: Admitting: Family Medicine

## 2024-12-08 DEATH — deceased

## 2025-01-06 ENCOUNTER — Ambulatory Visit: Admitting: Podiatry
